# Patient Record
Sex: Female | Born: 1990 | Race: Black or African American | Hispanic: No | Marital: Single | Smoking: Former smoker
Health system: Southern US, Community
[De-identification: ages and names within clinical notes are randomized; demographics above are authoritative.]

## PROBLEM LIST (undated history)

## (undated) DIAGNOSIS — O24419 Gestational diabetes mellitus in pregnancy, unspecified control: Secondary | ICD-10-CM

## (undated) DIAGNOSIS — Z8742 Personal history of other diseases of the female genital tract: Secondary | ICD-10-CM

## (undated) DIAGNOSIS — Z8632 Personal history of gestational diabetes: Secondary | ICD-10-CM

## (undated) HISTORY — DX: Personal history of other diseases of the female genital tract: Z87.42

## (undated) HISTORY — DX: Personal history of gestational diabetes: Z86.32

---

## 2017-01-10 DIAGNOSIS — N644 Mastodynia: Secondary | ICD-10-CM | POA: Diagnosis not present

## 2017-01-10 DIAGNOSIS — Z309 Encounter for contraceptive management, unspecified: Secondary | ICD-10-CM | POA: Diagnosis not present

## 2017-03-18 DIAGNOSIS — J020 Streptococcal pharyngitis: Secondary | ICD-10-CM | POA: Diagnosis not present

## 2017-10-15 HISTORY — PX: WISDOM TOOTH EXTRACTION: SHX21

## 2019-08-07 DIAGNOSIS — Z3009 Encounter for other general counseling and advice on contraception: Secondary | ICD-10-CM | POA: Diagnosis not present

## 2019-08-07 DIAGNOSIS — Z3200 Encounter for pregnancy test, result unknown: Secondary | ICD-10-CM | POA: Diagnosis not present

## 2019-08-07 LAB — PREGNANCY, URINE: Preg Test, Ur: POSITIVE

## 2019-08-10 ENCOUNTER — Encounter (HOSPITAL_BASED_OUTPATIENT_CLINIC_OR_DEPARTMENT_OTHER): Payer: Self-pay | Admitting: *Deleted

## 2019-08-10 ENCOUNTER — Emergency Department (HOSPITAL_BASED_OUTPATIENT_CLINIC_OR_DEPARTMENT_OTHER)
Admission: EM | Admit: 2019-08-10 | Discharge: 2019-08-10 | Disposition: A | Discharge status: Home / Self Care | Payer: Medicaid Other | Attending: Emergency Medicine | Admitting: Emergency Medicine

## 2019-08-10 ENCOUNTER — Emergency Department (HOSPITAL_BASED_OUTPATIENT_CLINIC_OR_DEPARTMENT_OTHER): Payer: Medicaid Other

## 2019-08-10 ENCOUNTER — Other Ambulatory Visit: Payer: Self-pay

## 2019-08-10 DIAGNOSIS — Z3A01 Less than 8 weeks gestation of pregnancy: Secondary | ICD-10-CM | POA: Insufficient documentation

## 2019-08-10 DIAGNOSIS — O26891 Other specified pregnancy related conditions, first trimester: Secondary | ICD-10-CM | POA: Insufficient documentation

## 2019-08-10 DIAGNOSIS — O99891 Other specified diseases and conditions complicating pregnancy: Secondary | ICD-10-CM | POA: Diagnosis not present

## 2019-08-10 DIAGNOSIS — O26899 Other specified pregnancy related conditions, unspecified trimester: Secondary | ICD-10-CM

## 2019-08-10 DIAGNOSIS — Z3491 Encounter for supervision of normal pregnancy, unspecified, first trimester: Secondary | ICD-10-CM

## 2019-08-10 DIAGNOSIS — O0001 Abdominal pregnancy with intrauterine pregnancy: Secondary | ICD-10-CM | POA: Diagnosis not present

## 2019-08-10 DIAGNOSIS — Z87891 Personal history of nicotine dependence: Secondary | ICD-10-CM | POA: Insufficient documentation

## 2019-08-10 DIAGNOSIS — R1031 Right lower quadrant pain: Secondary | ICD-10-CM | POA: Diagnosis not present

## 2019-08-10 DIAGNOSIS — R202 Paresthesia of skin: Secondary | ICD-10-CM | POA: Diagnosis not present

## 2019-08-10 DIAGNOSIS — M79609 Pain in unspecified limb: Secondary | ICD-10-CM

## 2019-08-10 DIAGNOSIS — M79601 Pain in right arm: Secondary | ICD-10-CM | POA: Insufficient documentation

## 2019-08-10 LAB — PREGNANCY, URINE: Preg Test, Ur: POSITIVE — AB

## 2019-08-10 LAB — HCG, QUANTITATIVE, PREGNANCY: hCG, Beta Chain, Quant, S: 42404 m[IU]/mL — ABNORMAL HIGH (ref <5)

## 2019-08-10 MED ORDER — ONDANSETRON 4 MG PO TBDP: 4.0000 mg | ORAL_TABLET | Freq: Three times a day (TID) | ORAL | 0 refills | Status: DC | PRN

## 2019-08-10 MED ORDER — ONDANSETRON 4 MG PO TBDP
4.0000 mg | ORAL_TABLET | Freq: Once | ORAL | Status: AC
  Administered 2019-08-10: 14:00:00 4 mg via ORAL

## 2019-08-10 MED ORDER — ONDANSETRON 4 MG PO TBDP
ORAL_TABLET | ORAL | Status: AC
  Filled 2019-08-10: qty 1

## 2019-08-10 NOTE — ED Notes (Signed)
Patient transported to Ultrasound 

## 2019-08-10 NOTE — ED Notes (Signed)
ED Provider at bedside. 

## 2019-08-10 NOTE — Discharge Instructions (Signed)
Take 1 to 2 tablets of Tylenol every 6 hours as needed for pain.  Apply heat 20 minutes at a time 3-4 times daily as needed for muscle soreness and spasm.  You can also massage the area.  You can YouTube search physical therapy exercises for cervical radiculopathy or neck exercises.  Take Zofran as needed for nausea and vomiting.  Let this medicine dissolve under your tongue and wait around 10 or 15 minutes before you have anything to eat or drink to give this medicine time to work.  Follow-up with OB/GYN for reevaluation of pregnancy; ultrasound today was reassuring.  Follow-up with primary care provider for reevaluation of neck pain with numbness and tingling of the right arm.  Return to the emergency department if any concerning signs or symptoms develop such as persistent weakness, high fevers, persistent vomiting, or loss of consciousness.  Go to [facility] for any emergent pregnancy related concerns.

## 2019-08-10 NOTE — ED Provider Notes (Signed)
[facility] EMERGENCY DEPARTMENT Provider Note   CSN: [id number] Arrival date & time: 08/10/19  1110     History   Chief Complaint Chief Complaint  Patient presents with   Arm Pain    HPI Lauren Delacruz is a 28 y.o. female presents today for evaluation of intermittent paresthesias of the right upper extremity for 1 week as well as acute onset, progressively worsening lower abdominal cramping for 2 weeks.  She reports 2 weeks ago she found out she was pregnant, thinks she is around [redacted] weeks along.  Denies vaginal bleeding but does note cramping which is worse than usual menstrual cramps primarily on the right side of the abdomen but will radiate along the lower abdomen.  No aggravating or alleviating factors noted.  Denies any nausea or vomiting.  Has not been to see her OB/GYN yet.  No history of ectopic pregnancy.  She notes that for the last week she has experienced 4 episodes in which she will develop pain and paresthesias radiating from the right side of the neck to the shoulder down into the fingertips.  It does not seem to follow a particular path down the extremity or into specific fingers but more so all over.  Twice it occurred when she was awakening from sleep and the other 2 times it occurred while she was sitting on her couch.  She will feel as though the arm is heavy and weak as well.  Symptoms will last for around 5 or 6 minutes before resolving.  No specific injury noted though she did have a fall sometime in September does not think this is related.  Has not tried anything for her symptoms.  Denies chest pain, shortness of breath, headaches.     The history is provided by the patient.  Arm Pain Associated symptoms include abdominal pain. Pertinent negatives include no chest pain, no headaches and no shortness of breath.    History reviewed. No pertinent past medical history.  There are no active problems to display for this patient.   History reviewed. No  pertinent surgical history.   OB History    Gravida  3   Para  1   Term  1   Preterm  0   AB  1   Living  1     SAB  0   TAB  1   Ectopic  0   Multiple  0   Live Births  1            Home Medications    Prior to Admission medications   Medication Sig Start Date End Date Taking? Authorizing Provider  ondansetron (ZOFRAN ODT) 4 MG disintegrating tablet Take 1 tablet (4 mg total) by mouth every 8 (eight) hours as needed for nausea or vomiting. 08/10/19   Jeanie SewerFawze, [place] A, PA-C    Family History No family history on file.  Social History Social History   Tobacco Use   Smoking status: Former Smoker   Smokeless tobacco: Never Used   Tobacco comment: socially  Substance Use Topics   Alcohol use: Yes    Comment: socially   Drug use: Never     Allergies   Penicillins   Review of Systems Review of Systems  Constitutional: Negative for chills and fever.  Respiratory: Negative for shortness of breath.   Cardiovascular: Negative for chest pain.  Gastrointestinal: Positive for abdominal pain. Negative for nausea and vomiting.  Genitourinary: Negative for hematuria, vaginal bleeding, vaginal discharge and vaginal  pain.  Musculoskeletal: Positive for myalgias.  Neurological: Positive for weakness and numbness. Negative for headaches.  All other systems reviewed and are negative.    Physical Exam Updated Vital Signs BP 124/85 (BP Location: Left Arm)    Temp 98.5 F (36.9 C) (Oral)    Resp 16    Ht 5\' 4"  (1.626 m)    Wt 76.7 kg    LMP 06/30/2019 (Within Days) Comment: due 04/05/20   SpO2 100%    BMI 29.01 kg/m   Physical Exam Vitals signs and nursing note reviewed.  Constitutional:      General: She is not in acute distress.    Appearance: She is well-developed.  HENT:     Head: Normocephalic and atraumatic.  Eyes:     General:        Right eye: No discharge.        Left eye: No discharge.     Conjunctiva/sclera: Conjunctivae normal.  Neck:      Musculoskeletal: Normal range of motion and neck supple.     Vascular: No JVD.     Trachea: No tracheal deviation.     Comments: No midline cervical spine tenderness.  No deformity, crepitus, or step-off.  Right-sided paracervical muscle tenderness and spasm noted. Cardiovascular:     Rate and Rhythm: Normal rate.  Pulmonary:     Effort: Pulmonary effort is normal.  Abdominal:     General: Bowel sounds are normal. There is no distension.     Palpations: Abdomen is soft.     Tenderness: There is abdominal tenderness in the right lower quadrant and suprapubic area. There is no guarding or rebound.  Musculoskeletal: Normal range of motion.     Comments: 5/5 strength of BUE major muscle groups.  Spurling test positive  Skin:    General: Skin is warm and dry.     Findings: No erythema.  Neurological:     Mental Status: She is alert.     Comments: Sensation intact to light touch of bilateral upper and lower extremities.  Spurling test positive  Psychiatric:        Behavior: Behavior normal.      ED Treatments / Results  Labs (all labs ordered are listed, but only abnormal results are displayed) Labs Reviewed  HCG, QUANTITATIVE, PREGNANCY - Abnormal; Notable for the following components:      Result Value   hCG, Beta Chain, Quant, S 42,404 (*)    All other components within normal limits  PREGNANCY, URINE - Abnormal; Notable for the following components:   Preg Test, Ur POSITIVE (*)    All other components within normal limits    EKG None  Radiology 04/07/20 Ob Less Than 14 Weeks With Ob Transvaginal  Result Date: 08/10/2019 CLINICAL DATA:  Right lower quadrant pain 2-3 weeks. Positive urine pregnancy test. Serum quantitative beta HCG 42,404 today. Estimated gestational age per LMP 5 weeks 6 days. EXAM: OBSTETRIC <14 WK 08/12/2019 AND TRANSVAGINAL OB US TECHNIQUE: Both transabdominal and transvaginal ultrasound examinations were performed for complete evaluation of the gestation as well as  the maternal uterus, adnexal regions, and pelvic cul-de-sac. Transvaginal technique was performed to assess early pregnancy. COMPARISON:  None. FINDINGS: Intrauterine gestational sac: Single normal-appearing intrauterine gestational sac. Yolk sac:  Visualized. Embryo:  Visualized. Cardiac Activity: Visualized. Heart Rate: 101 bpm CRL: 3.9 mm   6 w   0 d                  [place]  EDC: 04/04/2020 Subchorionic hemorrhage:  None visualized. Maternal uterus/adnexae: Nabothian cyst present over the cervix. Ovaries are normal size, shape and position with normal color flow. Probable small corpus luteal cyst over the left ovary. IMPRESSION: Single live IUP with estimated gestational age [redacted] weeks 0 days. Electronically Signed   By: Marin Olp M.D.   On: 08/10/2019 15:13    Procedures Procedures (including critical care time)  Medications Ordered in ED Medications  ondansetron (ZOFRAN-ODT) 4 MG disintegrating tablet (has no administration in time range)  ondansetron (ZOFRAN-ODT) disintegrating tablet 4 mg (4 mg Oral Given 08/10/19 1411)     Initial Impression / Assessment and Plan / ED Course  I have reviewed the triage vital signs and the nursing notes.  Pertinent labs & imaging results that were available during my care of the patient were reviewed by me and considered in my medical decision making (see chart for details).  Clinical Course as of Aug 09 1604  Mon Aug 10, 2019  1401 HCG, Bosie Helper(!): 63,149 [MF]    Clinical Course User Index [MF] Renita Papa, [place]       Patient presenting for evaluation of intermittent right upper extremity paresthesias for 1 week as well as lower abdominal cramping for 2 weeks.  She estimates she is around [redacted] weeks pregnant.  She is afebrile, vital signs are stable.  She is nontoxic in appearance.  No peritoneal signs on examination of the abdomen.  As the pain is worse in the right lower quadrant will obtain quantitative hCG and pelvic ultrasound to rule  out ectopic pregnancy.  She has no vaginal bleeding or urinary symptoms.  Quantitative hCG appropriately elevated at 42,404, ultrasound shows single live IUP with estimated gestational age of [redacted] weeks and 0 days.  She recently relocated from [place] to the [place] area so we will give her resources for outpatient follow-up with OB/GYN.  She did develop some nausea while in the ED so she was given Zofran ODT with resolution of her symptoms I will give her a few tablets of this to go home with.  Also discussed management of hyperemesis in pregnancy.  With regards to her paresthesias, her symptoms are reproducible with Spurling's maneuver suggesting cervical radiculopathy.  She has no midline cervical spine tenderness, no fever or meningeal signs.  On my assessment she is neurovascularly intact.  No focal neurologic deficits.  Doubt CVA or TIA.  She also has some component of muscle spasm along the right trapezius.  This could also cause some nerve compression and irritation which could cause some of her symptoms.  We discussed conservative therapy and management with heat therapy, massage, gentle stretching, formal physical therapy and dry needling.  Recommend follow-up with PCP for reevaluation of her symptoms.  Discussed strict ED return precautions.  Patient and significant other verbalized understanding of and agreement with plan and patient stable for discharge home at this time.  Final Clinical Impressions(s) / ED Diagnoses   Final diagnoses:  Paresthesia and pain of right extremity  Normal intrauterine pregnancy on prenatal ultrasound in first trimester    ED Discharge Orders         Ordered    ondansetron (ZOFRAN ODT) 4 MG disintegrating tablet  Every 8 hours PRN     08/10/19 [address] [place] A, PA-C 08/10/19 1608    Isla Pence, MD 08/11/19 [phone]

## 2019-08-10 NOTE — ED Triage Notes (Addendum)
Tingling in arm  From neck to finger tips has happened 4 times in past week  Lasting 5 minutes at times,  Occurs when wakes in am  6 weeks preg

## 2019-09-28 ENCOUNTER — Encounter: Payer: Self-pay | Admitting: *Deleted

## 2019-09-29 ENCOUNTER — Encounter: Payer: Self-pay | Admitting: *Deleted

## 2019-10-15 ENCOUNTER — Other Ambulatory Visit: Payer: Self-pay

## 2019-10-15 ENCOUNTER — Ambulatory Visit (INDEPENDENT_AMBULATORY_CARE_PROVIDER_SITE_OTHER): Payer: Medicaid Other | Admitting: *Deleted

## 2019-10-15 DIAGNOSIS — Z8632 Personal history of gestational diabetes: Secondary | ICD-10-CM | POA: Insufficient documentation

## 2019-10-15 DIAGNOSIS — Z3490 Encounter for supervision of normal pregnancy, unspecified, unspecified trimester: Secondary | ICD-10-CM

## 2019-10-15 MED ORDER — BLOOD PRESSURE KIT DEVI: 1.0000 | 0 refills | Status: DC | PRN

## 2019-10-15 NOTE — Progress Notes (Signed)
I connected with  Lauren Delacruz on 10/15/19 at 10:30 AM EST by telephone and verified that I am speaking with the correct person using two identifiers.   I discussed the limitations, risks, security and privacy concerns of performing an evaluation and management service by telephone and the availability of in person appointments. I also discussed with the patient that there may be a patient responsible charge related to this service. The patient expressed understanding and agreed to proceed. Explained I am completing her New OB Intake today. We discussed Her EDD and that it is based on  sure LMP . I reviewed her allergies, meds, OB History, Medical /Surgical history, and appropriate screenings. I explained I will send her the [employer] app- app sent to her while on phone.  I explained we will send a blood pressure cuff to [facility] that will fill that prescription and they  will call her to verify her information. I asked her to bring the blood pressure cuff with her to her first ob appointment  Or next ob appointment so we can show her how to use it.- or if needed to call for a nurse visit appointment. I  Explained  then we will have her take her blood pressure weekly and enter into the app. Explained she will have some visits in office and some virtually. I sent her a MyChart text and she signed up for MyChart and downloaded the app. I reviewed her new ob  appointment date/ time with her , our location and to wear mask, no visitors.  I explained she will have a pelvic exam, ob bloodwork, hemoglobin a1C, cbg , genetic testing if desired,- she does want a panorama,  pap if needed. I scheduled an [place] at 19 weeks and gave her the appointment. She voices understanding.   Linda,RN 10/15/2019  10:37 AM

## 2019-10-15 NOTE — Patient Instructions (Signed)

## 2019-10-16 NOTE — L&D Delivery Note (Addendum)
Patient is 29 y.o. [id number] [redacted]w[redacted]d admitted for PPROM.    Delivery Note At 8:37 AM a viable female was delivered via Vaginal, Spontaneous (Presentation: Middle Occiput Anterior).  APGAR: 8, 9; weight pending.   Placenta status: Spontaneous, Intact. Cord: 3 vessels with the following complications: none.    Anesthesia: Epidural Episiotomy: None Lacerations: None Suture Repair: none Est. Blood Loss (mL): 300  Mom to postpartum.  Baby to Couplet care / Skin to Skin.  Upon arrival baby was already delivered and resting comfortably in moms arms.  There was no evidence of distress for the mother or baby.    The baby was placed on maternal abdomen for oral suctioning, drying and stimulation. Delayed cord clamping performed. Placenta delivered intact with 3V cord. Vaginal canal and perineum and intact. Pitocin was started and uterus massaged until bleeding slowed. Counts of sharps, instruments, and lap pads were all correct.   Mirian Mo, MD PGY-2 5/30/20218:54 AM  Midwife attestation: I was gloved and present for delivery in its entirety and I agree with the above resident's note.  Donette Larry, CNM 1:07 PM

## 2019-10-19 ENCOUNTER — Other Ambulatory Visit (HOSPITAL_COMMUNITY)
Admission: RE | Admit: 2019-10-19 | Discharge: 2019-10-19 | Disposition: A | Discharge status: Home / Self Care | Payer: Medicaid Other | Source: Ambulatory Visit | Attending: Obstetrics & Gynecology | Admitting: Obstetrics & Gynecology

## 2019-10-19 ENCOUNTER — Other Ambulatory Visit: Payer: Self-pay

## 2019-10-19 ENCOUNTER — Ambulatory Visit (INDEPENDENT_AMBULATORY_CARE_PROVIDER_SITE_OTHER): Payer: Medicaid Other | Admitting: Obstetrics & Gynecology

## 2019-10-19 VITALS — BP 121/67 | HR 80 | Wt 179.0 lb

## 2019-10-19 DIAGNOSIS — Z1389 Encounter for screening for other disorder: Secondary | ICD-10-CM

## 2019-10-19 DIAGNOSIS — Z8632 Personal history of gestational diabetes: Secondary | ICD-10-CM | POA: Diagnosis not present

## 2019-10-19 DIAGNOSIS — O09899 Supervision of other high risk pregnancies, unspecified trimester: Secondary | ICD-10-CM | POA: Diagnosis not present

## 2019-10-19 DIAGNOSIS — Z3490 Encounter for supervision of normal pregnancy, unspecified, unspecified trimester: Secondary | ICD-10-CM | POA: Diagnosis not present

## 2019-10-19 DIAGNOSIS — Z3A15 15 weeks gestation of pregnancy: Secondary | ICD-10-CM

## 2019-10-19 DIAGNOSIS — Z315 Encounter for genetic counseling: Secondary | ICD-10-CM | POA: Diagnosis not present

## 2019-10-19 DIAGNOSIS — O09892 Supervision of other high risk pregnancies, second trimester: Secondary | ICD-10-CM

## 2019-10-19 LAB — HEMOGLOBIN A1C
Est. average glucose Bld gHb Est-mCnc: 111 mg/dL
Hgb A1c MFr Bld: 5.5 % (ref 4.8–5.6)

## 2019-10-19 MED ORDER — METRONIDAZOLE 500 MG PO TABS: 500.0000 mg | ORAL_TABLET | Freq: Two times a day (BID) | ORAL | 0 refills | Status: DC

## 2019-10-19 NOTE — Progress Notes (Signed)
  Subjective:    Lauren Delacruz is being seen today for her first obstetrical visit.  This is not a planned pregnancy, but is a happy surprise. She is at [redacted]w[redacted]d gestation. Her obstetrical history is significant for h/o GDM with first pregnancy. Relationship with FOB: significant other, living together. Patient does intend to breast feed. Pregnancy history fully reviewed.  Patient reports no complaints.  Review of Systems:   Review of Systems  Objective:     BP 121/67   Pulse 80   Wt 179 lb (81.2 kg)   LMP 06/30/2019 (Within Days) Comment: due 04/05/20  BMI 30.73 kg/m  Physical Exam  Exam GENERAL: Well-developed, well-nourished female in no acute distress.  HEENT: Normocephalic, atraumatic. Sclerae anicteric.  NECK: Supple. Normal thyroid.  LUNGS: Clear to auscultation bilaterally.  HEART: Regular rate and rhythm. BREASTS: Symmetric with everted nipples. No masses, skin changes, nipple drainage, or lymphadenopathy. ABDOMEN: Soft, nontender, nondistended. No organomegaly. PELVIC: Normal external female genitalia. Vagina is pink and rugated.  Normal discharge. Normal cervix contour. Pap smear obtained. Frothy vaginal discharge. No adnexal mass or tenderness.  EXTREMITIES: No cyanosis, clubbing, or edema, 2+ distal pulses.     Assessment:    Pregnancy: G3P1011 Patient Active Problem List   Diagnosis Date Noted  . Supervision of other high risk pregnancy, antepartum 10/19/2019  . Supervision of low-risk pregnancy 10/15/2019  . History of gestational diabetes mellitus (GDM)        Plan:     Initial labs drawn. Prenatal vitamins. Problem list reviewed and updated. NIPS drawn Role of ultrasound in pregnancy discussed; fetal survey: ordered. Amniocentesis discussed: not indicated. Follow up in 4 weeks for early 2 hour GTT Flagyl prescribed and wet prep sent Pap obtained She declines flu vaccine   Allie Bossier 10/19/2019

## 2019-10-19 NOTE — Progress Notes (Signed)
Medicaid home form completed.   Fleet Contras RN 10/19/19

## 2019-10-20 ENCOUNTER — Other Ambulatory Visit: Payer: Self-pay | Admitting: Obstetrics & Gynecology

## 2019-10-20 ENCOUNTER — Encounter: Payer: Self-pay | Admitting: Obstetrics & Gynecology

## 2019-10-20 DIAGNOSIS — O99019 Anemia complicating pregnancy, unspecified trimester: Secondary | ICD-10-CM | POA: Insufficient documentation

## 2019-10-20 LAB — OBSTETRIC PANEL, INCLUDING HIV
Antibody Screen: NEGATIVE
Basophils Absolute: 0 10*3/uL (ref 0.0–0.2)
Basos: 0 %
EOS (ABSOLUTE): 0.1 10*3/uL (ref 0.0–0.4)
Eos: 1 %
HIV Screen 4th Generation wRfx: NONREACTIVE
Hematocrit: 29.6 % — ABNORMAL LOW (ref 34.0–46.6)
Hemoglobin: 9.9 g/dL — ABNORMAL LOW (ref 11.1–15.9)
Hepatitis B Surface Ag: NEGATIVE
Immature Grans (Abs): 0.1 10*3/uL (ref 0.0–0.1)
Immature Granulocytes: 1 %
Lymphocytes Absolute: 1.9 10*3/uL (ref 0.7–3.1)
Lymphs: 16 %
MCH: 28.2 pg (ref 26.6–33.0)
MCHC: 33.4 g/dL (ref 31.5–35.7)
MCV: 84 fL (ref 79–97)
Monocytes Absolute: 0.5 10*3/uL (ref 0.1–0.9)
Monocytes: 5 %
Neutrophils Absolute: 9.2 10*3/uL — ABNORMAL HIGH (ref 1.4–7.0)
Neutrophils: 77 %
Platelets: 322 10*3/uL (ref 150–450)
RBC: 3.51 x10E6/uL — ABNORMAL LOW (ref 3.77–5.28)
RDW: 13.1 % (ref 11.7–15.4)
RPR Ser Ql: NONREACTIVE
Rh Factor: POSITIVE
Rubella Antibodies, IGG: 3.13 index (ref >0.99)
WBC: 11.8 10*3/uL — ABNORMAL HIGH (ref 3.4–10.8)

## 2019-10-20 LAB — CERVICOVAGINAL ANCILLARY ONLY
Bacterial Vaginitis (gardnerella): POSITIVE — AB
Candida Glabrata: NEGATIVE
Candida Vaginitis: POSITIVE — AB
Chlamydia: NEGATIVE
Comment: NEGATIVE
Comment: NEGATIVE
Comment: NEGATIVE
Comment: NEGATIVE
Comment: NEGATIVE
Comment: NORMAL
Neisseria Gonorrhea: NEGATIVE
Trichomonas: NEGATIVE

## 2019-10-20 LAB — CYTOLOGY - PAP
Chlamydia: NEGATIVE
Comment: NEGATIVE
Comment: NEGATIVE
Comment: NORMAL
Diagnosis: NEGATIVE
High risk HPV: NEGATIVE
Neisseria Gonorrhea: NEGATIVE

## 2019-10-20 NOTE — Progress Notes (Signed)
fereheme ordered for hbg of 9.9 at 16 weeks I sent a message to patient through Mychart

## 2019-10-21 ENCOUNTER — Encounter: Payer: Self-pay | Admitting: *Deleted

## 2019-10-21 LAB — URINE CULTURE, OB REFLEX

## 2019-10-21 LAB — CULTURE, OB URINE

## 2019-10-23 NOTE — Progress Notes (Signed)
I have reviewed this chart and agree with the RN/CMA assessment and management.    Honesti Seaberg C Evetta Renner, MD, FACOG Attending Physician, Faculty Practice [facility]  

## 2019-10-28 ENCOUNTER — Encounter: Payer: Self-pay | Admitting: *Deleted

## 2019-11-03 ENCOUNTER — Telehealth (INDEPENDENT_AMBULATORY_CARE_PROVIDER_SITE_OTHER): Payer: Medicaid Other

## 2019-11-03 DIAGNOSIS — Z712 Person consulting for explanation of examination or test findings: Secondary | ICD-10-CM

## 2019-11-03 NOTE — Telephone Encounter (Signed)
Called pt with [employer]. Pt states she saw that she was a carrier for 1 disease. 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 Pt would like to know if baby's father needs to be screened. Phone number given for pt to make genetic counseling appt. Explained that during that appointment Lauren Delacruz will let her know if any more screening is necessary.   Pt requests appts on 11/16/19 be moved due to a conflict with her work schedule. Message sent to front office to contact pt to reschedule both appts on 11/16/19.

## 2019-11-04 ENCOUNTER — Telehealth: Payer: Self-pay | Admitting: Obstetrics & Gynecology

## 2019-11-04 NOTE — Telephone Encounter (Signed)
Spoke with Lauren Delacruz this morning about her appointment. She stated she started a new job, and her working hours are 9am to 6pm. She is wanting to know if she can do a 1 hour GTT test.

## 2019-11-04 NOTE — Telephone Encounter (Signed)
-----   Message from Marjo Bicker, RN sent at 11/03/2019  1:39 PM EST ----- Regarding: reschedule appts on 2/1 She is starting a new job on 2/1 and will not be able to make either appt. Can her appts be rescheduled? She was coming for prenatal and 2hr GTT lab visit. Thanks!

## 2019-11-09 ENCOUNTER — Encounter: Payer: Self-pay | Admitting: *Deleted

## 2019-11-10 ENCOUNTER — Encounter: Payer: Self-pay | Admitting: Obstetrics & Gynecology

## 2019-11-10 DIAGNOSIS — D563 Thalassemia minor: Secondary | ICD-10-CM | POA: Insufficient documentation

## 2019-11-12 ENCOUNTER — Ambulatory Visit (HOSPITAL_COMMUNITY)
Admission: RE | Admit: 2019-11-12 | Discharge: 2019-11-12 | Disposition: A | Discharge status: Home / Self Care | Payer: Medicaid Other | Source: Ambulatory Visit | Attending: Obstetrics & Gynecology | Admitting: Obstetrics & Gynecology

## 2019-11-12 ENCOUNTER — Other Ambulatory Visit: Payer: Self-pay

## 2019-11-12 DIAGNOSIS — Z148 Genetic carrier of other disease: Secondary | ICD-10-CM | POA: Diagnosis not present

## 2019-11-12 DIAGNOSIS — Z3A19 19 weeks gestation of pregnancy: Secondary | ICD-10-CM

## 2019-11-12 DIAGNOSIS — Z3490 Encounter for supervision of normal pregnancy, unspecified, unspecified trimester: Secondary | ICD-10-CM | POA: Insufficient documentation

## 2019-11-16 ENCOUNTER — Encounter: Payer: Medicaid Other | Admitting: Nurse Practitioner

## 2019-11-16 ENCOUNTER — Other Ambulatory Visit: Payer: Medicaid Other

## 2019-11-30 ENCOUNTER — Telehealth: Payer: Self-pay | Admitting: Family Medicine

## 2019-11-30 NOTE — Telephone Encounter (Signed)
Ms. 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 She will not be able to have an appointment until March. She understands it's been awhile, but she has to complete her training.

## 2019-12-01 ENCOUNTER — Encounter: Payer: Medicaid Other | Admitting: Certified Nurse Midwife

## 2019-12-16 ENCOUNTER — Ambulatory Visit (INDEPENDENT_AMBULATORY_CARE_PROVIDER_SITE_OTHER): Payer: Medicaid Other

## 2019-12-16 ENCOUNTER — Other Ambulatory Visit: Payer: Self-pay

## 2019-12-16 VITALS — BP 124/72 | HR 95 | Wt 187.0 lb

## 2019-12-16 DIAGNOSIS — Z8632 Personal history of gestational diabetes: Secondary | ICD-10-CM | POA: Diagnosis not present

## 2019-12-16 DIAGNOSIS — O09892 Supervision of other high risk pregnancies, second trimester: Secondary | ICD-10-CM

## 2019-12-16 DIAGNOSIS — Z3A24 24 weeks gestation of pregnancy: Secondary | ICD-10-CM

## 2019-12-16 DIAGNOSIS — O09899 Supervision of other high risk pregnancies, unspecified trimester: Secondary | ICD-10-CM

## 2019-12-16 DIAGNOSIS — O99012 Anemia complicating pregnancy, second trimester: Secondary | ICD-10-CM

## 2019-12-16 NOTE — Addendum Note (Signed)
Addended by: Ernestina Patches on: 12/16/2019 10:38 AM   Modules accepted: Orders

## 2019-12-16 NOTE — Progress Notes (Signed)
   PRENATAL VISIT NOTE  Subjective:  Lauren Delacruz is a 29 y.o. G3P1011 at [redacted]w[redacted]d who presents today for routine prenatal care.  She is currently being monitored for supervision of a high-risk pregnancy with problems as listed below.  Patient endorses fetal movement.  She denies vaginal concerns including discharge, bleeding, leaking, itching, and burning. However, she does report some pelvic pressure.  She denies abdominal pain, cramping, or contractions.   Patient Active Problem List   Diagnosis Date Noted  . Alpha thalassemia silent carrier 11/10/2019  . Anemia in pregnancy 10/20/2019  . Supervision of other high risk pregnancy, antepartum 10/19/2019  . History of gestational diabetes mellitus (GDM)     The following portions of the patient's history were reviewed and updated as appropriate: allergies, current medications, past family history, past medical history, past social history, past surgical history and problem list. Problem list updated.  Objective:   Vitals:   12/16/19 0959  BP: 124/72  Pulse: 95  Weight: 187 lb (84.8 kg)    Fetal Status: Fetal Heart Rate (bpm): 154   Movement: Present     General:  Alert, oriented and cooperative. Patient is in no acute distress.  Skin: Skin is warm and dry.   Cardiovascular: Regular rate and rhythm.  Respiratory: Normal respiratory effort. CTA-Bilaterally  Abdomen: Soft, gravid, appropriate for gestational age.  Pelvic: Cervical exam deferred        Extremities: Normal range of motion.  Edema: None  Mental Status: Normal mood and affect. Normal behavior. Normal judgment and thought content.   Assessment and Plan:  Pregnancy: G3P1011 at [redacted]w[redacted]d  1. Supervision of other high risk pregnancy, antepartum -Anticipatory guidance for upcoming appts. -Reviewed normal pregnancy discomforts including pelvic pain. -Discussed supplementations during pregnancy.  Informed that PNV, D3, and fish oil are more than enough!  2. History of  gestational diabetes mellitus (GDM) -Early GTT collected today. -Reviewed how results are released. -Discussed what will occur with abnormal results.    3. Anemia during pregnancy in second trimester -Instructed to increase iron supplement to BID and to take with OJ to increase absorption. -Discussed possibility of Feraheme infusion if levels remain low. -Will collect CBC   Preterm labor symptoms and general obstetric precautions including but not limited to vaginal bleeding, contractions, leaking of fluid and fetal movement were reviewed with the patient.  Please refer to After Visit Summary for other counseling recommendations.  Return in about 4 weeks (around 01/13/2020) for HR-ROB VV.  No future appointments.  Cherre Robins, CNM 12/16/2019, 10:11 AM

## 2019-12-17 DIAGNOSIS — G44209 Tension-type headache, unspecified, not intractable: Secondary | ICD-10-CM | POA: Diagnosis not present

## 2019-12-17 DIAGNOSIS — H40033 Anatomical narrow angle, bilateral: Secondary | ICD-10-CM | POA: Diagnosis not present

## 2019-12-17 LAB — GLUCOSE TOLERANCE, 1 HOUR: Glucose, 1Hr PP: 145 mg/dL (ref 65–199)

## 2019-12-31 ENCOUNTER — Encounter: Payer: Medicaid Other | Admitting: Certified Nurse Midwife

## 2020-01-01 ENCOUNTER — Encounter: Payer: Medicaid Other | Admitting: Family Medicine

## 2020-01-04 ENCOUNTER — Encounter (HOSPITAL_COMMUNITY): Payer: Self-pay | Admitting: Emergency Medicine

## 2020-01-04 ENCOUNTER — Ambulatory Visit (HOSPITAL_COMMUNITY)
Admission: EM | Admit: 2020-01-04 | Discharge: 2020-01-04 | Disposition: A | Discharge status: Home / Self Care | Payer: Medicaid Other | Attending: Physician Assistant | Admitting: Physician Assistant

## 2020-01-04 ENCOUNTER — Other Ambulatory Visit: Payer: Self-pay

## 2020-01-04 DIAGNOSIS — A084 Viral intestinal infection, unspecified: Secondary | ICD-10-CM | POA: Insufficient documentation

## 2020-01-04 DIAGNOSIS — Z8249 Family history of ischemic heart disease and other diseases of the circulatory system: Secondary | ICD-10-CM | POA: Diagnosis not present

## 2020-01-04 DIAGNOSIS — Z20822 Contact with and (suspected) exposure to covid-19: Secondary | ICD-10-CM | POA: Insufficient documentation

## 2020-01-04 DIAGNOSIS — R1110 Vomiting, unspecified: Secondary | ICD-10-CM | POA: Diagnosis present

## 2020-01-04 DIAGNOSIS — Z8632 Personal history of gestational diabetes: Secondary | ICD-10-CM

## 2020-01-04 DIAGNOSIS — Z880 Allergy status to penicillin: Secondary | ICD-10-CM | POA: Insufficient documentation

## 2020-01-04 DIAGNOSIS — O98512 Other viral diseases complicating pregnancy, second trimester: Secondary | ICD-10-CM | POA: Diagnosis not present

## 2020-01-04 DIAGNOSIS — Z3A27 27 weeks gestation of pregnancy: Secondary | ICD-10-CM | POA: Diagnosis not present

## 2020-01-04 DIAGNOSIS — Z87891 Personal history of nicotine dependence: Secondary | ICD-10-CM | POA: Insufficient documentation

## 2020-01-04 LAB — GLUCOSE, CAPILLARY: Glucose-Capillary: 97 mg/dL (ref 70–99)

## 2020-01-04 LAB — CBG MONITORING, ED: Glucose-Capillary: 97 mg/dL (ref 70–99)

## 2020-01-04 MED ORDER — ONDANSETRON 4 MG PO TBDP
4.0000 mg | ORAL_TABLET | Freq: Once | ORAL | Status: AC
  Administered 2020-01-04: 4 mg via ORAL

## 2020-01-04 MED ORDER — ONDANSETRON 4 MG PO TBDP
ORAL_TABLET | ORAL | Status: AC
  Filled 2020-01-04: qty 1

## 2020-01-04 MED ORDER — ONDANSETRON HCL 4 MG PO TABS: 4.0000 mg | ORAL_TABLET | Freq: Three times a day (TID) | ORAL | 0 refills | Status: DC | PRN

## 2020-01-04 NOTE — ED Provider Notes (Signed)
[place]    CSN: [id number] Arrival date & time: 01/04/20  1907      History   Chief Complaint Chief Complaint  Patient presents with  . Vomiting    HPI Lauren Delacruz is a 29 y.o. female.   Patient reports urgent care for evaluation of vomiting that started at noon today.  She reports 3-4 episodes of vomiting.  She has not been able to keep down solid food today.  She reports drinking some ginger ale however vomited most of this back up.  She has had minimal ability to keep water down.  She has also developed body aches and some chills throughout the day.  There has been no blood in the vomit.  She denies diarrhea.  She denies associated symptoms of cough, congestion, chest tightness or pain.  Denies abdominal pain, vaginal bleeding or discharge.  She reports recent increased thirst over the last week to 7-10 days.  She denies increased hunger.  She does feel like she has been dehydrated despite drinking a lot of water over the last week and a half.  She reports having issues with gestational diabetes in her first pregnancy.  However has not had this issue with this pregnancy and had a recent Glucola 2 to 3 weeks ago that was negative.  She did call her OB triage line and was instructed to come in and get evaluated today.     Past Medical History:  Diagnosis Date  . History of gestational diabetes mellitus (GDM)   . History of ovarian cyst     Patient Active Problem List   Diagnosis Date Noted  . Alpha thalassemia silent carrier 11/10/2019  . Anemia in pregnancy 10/20/2019  . Supervision of other high risk pregnancy, antepartum 10/19/2019  . History of gestational diabetes mellitus (GDM)     History reviewed. No pertinent surgical history.  OB History    Gravida  3   Para  1   Term  1   Preterm  0   AB  1   Living  1     SAB  0   TAB  1   Ectopic  0   Multiple  0   Live Births  1            Home Medications    Prior to Admission  medications   Medication Sig Start Date End Date Taking? Authorizing Provider  ferrous sulfate 325 (65 FE) MG tablet Take 325 mg by mouth daily with breakfast.   Yes [provider]  Prenatal Vit-Fe Fumarate-FA (MULTIVITAMIN-PRENATAL) 27-0.8 MG TABS tablet Take 1 tablet by mouth daily at 12 noon.   Yes [provider]  Blood Pressure Monitoring (BLOOD PRESSURE KIT) DEVI 1 Device by Does not apply route as needed. 10/15/19   Emily Filbert, MD  metroNIDAZOLE (FLAGYL) 500 MG tablet Take 1 tablet (500 mg total) by mouth 2 (two) times daily. Patient not taking: Reported on 12/16/2019 10/19/19   Emily Filbert, MD  ondansetron (ZOFRAN) 4 MG tablet Take 1 tablet (4 mg total) by mouth every 8 (eight) hours as needed for nausea or vomiting. 01/04/20   Mariama Saintvil, Marguerita Beards, PA-C    Family History Family History  Problem Relation Age of Onset  . Hypertension Mother   . Hypertension Father   . Prostate cancer Father   . Breast cancer Paternal Aunt   . Hypertension Maternal Grandmother     Social History Social History   Tobacco Use  .  Smoking status: Former Research scientist (life sciences)  . Smokeless tobacco: Never Used  . Tobacco comment: socially  Substance Use Topics  . Alcohol use: Not Currently    Comment: socially  . Drug use: Never     Allergies   Penicillins   Review of Systems Review of Systems  Constitutional: Positive for chills and fatigue. Negative for fever.  HENT: Negative.   Respiratory: Negative for cough and shortness of breath.   Cardiovascular: Negative for chest pain and palpitations.  Gastrointestinal: Positive for nausea and vomiting. Negative for abdominal distention, abdominal pain, blood in stool and diarrhea.  Genitourinary: Negative for dysuria, frequency and urgency.  Musculoskeletal: Positive for myalgias. Negative for arthralgias and back pain.  Skin: Negative for color change and rash.  Neurological: Negative for headaches.     Physical Exam Triage Vital Signs ED  Triage Vitals [01/04/20 1954]  Enc Vitals Group     BP      Pulse      Resp      Temp      Temp src      SpO2      Weight      Height      Head Circumference      Peak Flow      Pain Score 9     Pain Loc      Pain Edu?      Excl. in [place]?    No data found.  Updated Vital Signs BP 113/71 (BP Location: Right Arm)   Pulse (!) 112   Temp 99.9 F (37.7 C) (Oral)   Resp (!) 22   LMP 06/30/2019 (Within Days) Comment: due 04/05/20  SpO2 100%   Visual Acuity Right Eye Distance:   Left Eye Distance:   Bilateral Distance:    Right Eye Near:   Left Eye Near:    Bilateral Near:     Physical Exam Vitals and nursing note reviewed.  Constitutional:      General: She is not in acute distress.    Appearance: She is well-developed. She is not ill-appearing.  HENT:     Head: Normocephalic and atraumatic.     Nose: Nose normal. No congestion.     Mouth/Throat:     Mouth: Mucous membranes are moist.     Pharynx: Oropharynx is clear.  Eyes:     Extraocular Movements: Extraocular movements intact.     Conjunctiva/sclera: Conjunctivae normal.     Pupils: Pupils are equal, round, and reactive to light.  Cardiovascular:     Rate and Rhythm: Normal rate and regular rhythm.     Heart sounds: No murmur.  Pulmonary:     Effort: Pulmonary effort is normal. No respiratory distress.     Breath sounds: Normal breath sounds.  Abdominal:     Palpations: Abdomen is soft.     Tenderness: There is no abdominal tenderness. There is no right CVA tenderness or left CVA tenderness.  Musculoskeletal:     Cervical back: Neck supple.     Right lower leg: No edema.     Left lower leg: No edema.  Skin:    General: Skin is warm and dry.  Neurological:     Mental Status: She is alert.      UC Treatments / Results  Labs (all labs ordered are listed, but only abnormal results are displayed) Labs Reviewed  SARS CORONAVIRUS 2 (TAT 6-24 HRS)  GLUCOSE, CAPILLARY  CBG MONITORING, ED     EKG   Radiology  No results found.  Procedures Procedures (including critical care time)  Medications Ordered in UC Medications  ondansetron (ZOFRAN-ODT) disintegrating tablet 4 mg (4 mg Oral Given 01/04/20 2035)    Initial Impression / Assessment and Plan / UC Course  I have reviewed the triage vital signs and the nursing notes.  Pertinent labs & imaging results that were available during my care of the patient were reviewed by me and considered in my medical decision making (see chart for details).     #Gastroenteritis Patient 29 year old [redacted] weeks pregnant presenting with acute onset of vomiting, body ache and subjective fever today.  She is well-appearing in clinic, low-grade fever mild increase in her heart rate she is also likely secondary to pregnancy as well.  Glucose normal.  Covid PCR was obtained and sent.  Given Zofran in clinic.  Given she is hemodynamically well without further complaint will send home with Zofran and instructions to follow-up with OB or report to emergency department if she continues to not tolerate oral intake.  Patient verbalizes understanding. -Increase p.o. liquid intake with water and electrolyte beverages.    Final Clinical Impressions(s) / UC Diagnoses   Final diagnoses:  Viral gastroenteritis     Discharge Instructions     We have given you nausea medication here in clinic.  Please pick up your nausea medication in the morning.  If you continue to have a lot of vomiting despite treatment developed a high fever or have severe abdominal pain , I want you to go to the maternal unit emergency department at [facility].  Drink small sips of water and electrolyte beverages.  You may try to eat small solid foods however I prefer for you to attempt liquids.  You may take 2 regular strength Tylenol up to every 6-8 hours for body ache and fever.  Follow-up with your OB at your next appointment.  If your Covid-19 test is positive, you will  receive a phone call from [facility] regarding your results. Negative test results are not called. Both positive and negative results area always visible on MyChart. If you do not have a MyChart account, sign up instructions are in your discharge papers.   Persons who are directed to care for themselves at home may discontinue isolation under the following conditions:  . At least 10 days have passed since symptom onset and . At least 24 hours have passed without running a fever (this means without the use of fever-reducing medications) and . Other symptoms have improved.  Persons infected with COVID-19 who never develop symptoms may discontinue isolation and other precautions 10 days after the date of their first positive COVID-19 test.     ED Prescriptions    Medication Sig Dispense Auth. Provider   ondansetron (ZOFRAN) 4 MG tablet  (Status: Discontinued) Take 1 tablet (4 mg total) by mouth every 8 (eight) hours as needed for nausea or vomiting. 6 tablet Merle Whitehorn, Marguerita Beards, PA-C   ondansetron (ZOFRAN) 4 MG tablet Take 1 tablet (4 mg total) by mouth every 8 (eight) hours as needed for nausea or vomiting. 6 tablet [employer], Marguerita Beards, PA-C     PDMP not reviewed this encounter.   Purnell Shoemaker, PA-C 01/05/20 [phone]

## 2020-01-04 NOTE — ED Triage Notes (Signed)
Vomiting started around noon, generalized body aches, exhausted.  Patient is [redacted] weeks pregnant

## 2020-01-04 NOTE — Discharge Instructions (Addendum)
We have given you nausea medication here in clinic.  Please pick up your nausea medication in the morning.  If you continue to have a lot of vomiting despite treatment developed a high fever or have severe abdominal pain , I want you to go to the maternal unit emergency department at [facility].  Drink small sips of water and electrolyte beverages.  You may try to eat small solid foods however I prefer for you to attempt liquids.  You may take 2 regular strength Tylenol up to every 6-8 hours for body ache and fever.  Follow-up with your OB at your next appointment.  If your Covid-19 test is positive, you will receive a phone call from [facility] regarding your results. Negative test results are not called. Both positive and negative results area always visible on MyChart. If you do not have a MyChart account, sign up instructions are in your discharge papers.   Persons who are directed to care for themselves at home may discontinue isolation under the following conditions:   At least 10 days have passed since symptom onset and  At least 24 hours have passed without running a fever (this means without the use of fever-reducing medications) and  Other symptoms have improved.  Persons infected with COVID-19 who never develop symptoms may discontinue isolation and other precautions 10 days after the date of their first positive COVID-19 test.

## 2020-01-05 LAB — SARS CORONAVIRUS 2 (TAT 6-24 HRS): SARS Coronavirus 2: NEGATIVE

## 2020-01-09 DIAGNOSIS — H04123 Dry eye syndrome of bilateral lacrimal glands: Secondary | ICD-10-CM | POA: Diagnosis not present

## 2020-01-10 DIAGNOSIS — H5213 Myopia, bilateral: Secondary | ICD-10-CM | POA: Diagnosis not present

## 2020-01-11 ENCOUNTER — Ambulatory Visit (INDEPENDENT_AMBULATORY_CARE_PROVIDER_SITE_OTHER): Payer: Medicaid Other | Admitting: Family Medicine

## 2020-01-11 ENCOUNTER — Other Ambulatory Visit: Payer: Self-pay

## 2020-01-11 VITALS — BP 118/66 | HR 105 | Wt 197.8 lb

## 2020-01-11 DIAGNOSIS — Z23 Encounter for immunization: Secondary | ICD-10-CM

## 2020-01-11 DIAGNOSIS — O09899 Supervision of other high risk pregnancies, unspecified trimester: Secondary | ICD-10-CM

## 2020-01-11 DIAGNOSIS — Z8632 Personal history of gestational diabetes: Secondary | ICD-10-CM

## 2020-01-11 DIAGNOSIS — O99013 Anemia complicating pregnancy, third trimester: Secondary | ICD-10-CM

## 2020-01-11 NOTE — Progress Notes (Signed)
   PRENATAL VISIT NOTE  Subjective:  Lauren Delacruz is a 29 y.o. G3P1011 at [redacted]w[redacted]d being seen today for ongoing prenatal care.  She is currently monitored for the following issues for this high-risk pregnancy and has History of gestational diabetes mellitus (GDM); Supervision of other high risk pregnancy, antepartum; Anemia in pregnancy; and Alpha thalassemia silent carrier on their problem list.  Patient reports no complaints.  Contractions: Not present. Vag. Bleeding: None.  Movement: Present. Denies leaking of fluid.   The following portions of the patient's history were reviewed and updated as appropriate: allergies, current medications, past family history, past medical history, past social history, past surgical history and problem list.   Objective:   Vitals:   01/11/20 1552  BP: 118/66  Pulse: (!) 105  Weight: 197 lb 12.8 oz (89.7 kg)    Fetal Status: Fetal Heart Rate (bpm): 148 Fundal Height: 28 cm Movement: Present     General:  Alert, oriented and cooperative. Patient is in no acute distress.  Skin: Skin is warm and dry. No rash noted.   Cardiovascular: Normal heart rate noted  Respiratory: Normal respiratory effort, no problems with respiration noted  Abdomen: Soft, gravid, appropriate for gestational age.  Pain/Pressure: Present     Pelvic: Cervical exam deferred        Extremities: Normal range of motion.  Edema: None  Mental Status: Normal mood and affect. Normal behavior. Normal judgment and thought content.   Assessment and Plan:  Pregnancy: G3P1011 at [redacted]w[redacted]d 1. Supervision of other high risk pregnancy, antepartum FHT and FH normal. Labs will be deferred until 3hr GTT as she didn't pass her 1hr GTT. - HIV Antibody (routine testing w rflx) - RPR - CBC  2. History of gestational diabetes mellitus (GDM) Needs 3hr GTT  3. Anemia during pregnancy in third trimester Check blood counts next visit  Preterm labor symptoms and general obstetric precautions including but  not limited to vaginal bleeding, contractions, leaking of fluid and fetal movement were reviewed in detail with the patient. Please refer to After Visit Summary for other counseling recommendations.   Return in about 2 weeks (around 01/25/2020) for HR OB f/u.  Future Appointments  Date Time Provider Department Center  01/13/2020  8:20 AM [facility]  01/29/2020  9:55 AM Anyanwu, Jethro Bastos, MD [facility]    Levie Heritage, DO

## 2020-01-13 ENCOUNTER — Telehealth: Payer: Self-pay | Admitting: Obstetrics & Gynecology

## 2020-01-13 ENCOUNTER — Other Ambulatory Visit: Payer: Medicaid Other

## 2020-01-13 ENCOUNTER — Other Ambulatory Visit: Payer: Self-pay | Admitting: General Practice

## 2020-01-13 DIAGNOSIS — Z8632 Personal history of gestational diabetes: Secondary | ICD-10-CM

## 2020-01-13 NOTE — Telephone Encounter (Signed)
Attempted to reach patient about her missed appointment. I left a voicemail message for her to call the office to get rescheduled.

## 2020-01-29 ENCOUNTER — Encounter: Payer: Self-pay | Admitting: Obstetrics & Gynecology

## 2020-01-29 ENCOUNTER — Ambulatory Visit (INDEPENDENT_AMBULATORY_CARE_PROVIDER_SITE_OTHER): Payer: Medicaid Other | Admitting: Obstetrics & Gynecology

## 2020-01-29 ENCOUNTER — Other Ambulatory Visit: Payer: Self-pay

## 2020-01-29 ENCOUNTER — Other Ambulatory Visit: Payer: Medicaid Other

## 2020-01-29 VITALS — BP 122/80 | HR 99 | Wt 197.0 lb

## 2020-01-29 DIAGNOSIS — O09899 Supervision of other high risk pregnancies, unspecified trimester: Secondary | ICD-10-CM | POA: Diagnosis not present

## 2020-01-29 DIAGNOSIS — Z8632 Personal history of gestational diabetes: Secondary | ICD-10-CM

## 2020-01-29 DIAGNOSIS — O99810 Abnormal glucose complicating pregnancy: Secondary | ICD-10-CM

## 2020-01-29 DIAGNOSIS — Z3A30 30 weeks gestation of pregnancy: Secondary | ICD-10-CM

## 2020-01-29 NOTE — Progress Notes (Signed)
   PRENATAL VISIT NOTE  Subjective:  Lauren Delacruz is a 29 y.o. G3P1011 at [redacted]w[redacted]d being seen today for ongoing prenatal care.  She is currently monitored for the following issues for this high-risk pregnancy and has History of gestational diabetes mellitus (GDM); Supervision of other high risk pregnancy, antepartum; Anemia in pregnancy; and Alpha thalassemia silent carrier on their problem list.  Patient reports no complaints.  Contractions: Not present. Vag. Bleeding: None.  Movement: Present. Denies leaking of fluid.   The following portions of the patient's history were reviewed and updated as appropriate: allergies, current medications, past family history, past medical history, past social history, past surgical history and problem list.   Objective:   Vitals:   01/29/20 0852  BP: 122/80  Pulse: 99  Weight: 197 lb (89.4 kg)    Fetal Status: Fetal Heart Rate (bpm): 132 Fundal Height: 30 cm Movement: Present     General:  Alert, oriented and cooperative. Patient is in no acute distress.  Skin: Skin is warm and dry. No rash noted.   Cardiovascular: Normal heart rate noted  Respiratory: Normal respiratory effort, no problems with respiration noted  Abdomen: Soft, gravid, appropriate for gestational age.  Pain/Pressure: Present     Pelvic: Cervical exam deferred        Extremities: Normal range of motion.  Edema: None  Mental Status: Normal mood and affect. Normal behavior. Normal judgment and thought content.   Assessment and Plan:  Pregnancy: G3P1011 at [redacted]w[redacted]d 1. Abnormal glucose tolerance test (GTT) during pregnancy, antepartum 2. History of gestational diabetes mellitus (GDM) 3. Supervision of other high risk pregnancy, antepartum Patient had abnormal 1 hr GTT, getting 2 hr GTT and other third trimester labs today.  Counseled about contraception, she desires immediate postplacental IUD (Paragard) vs Depo Provera. Preterm labor symptoms and general obstetric precautions including  but not limited to vaginal bleeding, contractions, leaking of fluid and fetal movement were reviewed in detail with the patient. Please refer to After Visit Summary for other counseling recommendations.   Return in about 2 weeks (around 02/12/2020) for Virtual OB Visit.  Future Appointments  Date Time Provider Department Center  02/10/2020  2:35 PM Akutan Bing, MD [facility]    Jaynie Collins, MD

## 2020-01-29 NOTE — Patient Instructions (Addendum)
Return to office for any scheduled appointments. Call the office or go to the MAU at [facility] if:  You begin to have strong, frequent contractions  Your water breaks.  Sometimes it is a big gush of fluid, sometimes it is just a trickle that keeps getting your panties wet or running down your legs  You have vaginal bleeding.  It is normal to have a small amount of spotting if your cervix was checked.   You do not feel your baby moving like normal.  If you do not, get something to eat and drink and lay down and focus on feeling your baby move.   If your baby is still not moving like normal, you should call the office or go to MAU.  Any other obstetric concerns.    Intrauterine Device Information An intrauterine device (IUD) is a medical device that is inserted in the uterus to prevent pregnancy. It is a small, T-shaped device that has one or two nylon strings hanging down from it. The strings hang out of the lower part of the uterus (cervix) to allow for future IUD removal. There are two types of IUDs available:  Hormone IUD. This type of IUD is made of plastic and contains the hormone progestin (synthetic progesterone). A hormone IUD may last 3-5 years.  Copper IUD. This type of IUD has copper wire wrapped around it. A copper IUD may last up to 10 years. How is an IUD inserted? An IUD is inserted through the vagina and placed into the uterus with a minor medical procedure. The exact procedure for IUD insertion may vary among health care providers and hospitals. How does an IUD work? Synthetic progesterone in a hormonal IUD prevents pregnancy by:  Thickening cervical mucus to prevent sperm from entering the uterus.  Thinning the uterine lining to prevent a fertilized egg from being implanted there. Copper in a copper IUD prevents pregnancy by making the uterus and fallopian tubes produce a fluid that kills sperm. What are the advantages of an  IUD? Advantages of either type of IUD  It is highly effective in preventing pregnancy.  It is reversible. You can become pregnant shortly after the IUD is removed.  It is low-maintenance and can stay in place for a long time.  There are no estrogen-related side effects.  It can be used when breastfeeding.  It is not associated with weight gain.  It can be inserted right after childbirth, an abortion, or a miscarriage. Advantages of a hormone IUD  If it is inserted within 7 days of your period starting, it works right after it is inserted. If the hormone IUD is inserted at any other time in your cycle, you will need to use a backup method of birth control for 7 days after insertion.  It can make menstrual periods lighter.  It can reduce menstrual cramping.  It can be used for 3-5 years. Advantages of a copper IUD  It works right after it is inserted.  It can be used as a form of emergency birth control if it is inserted within 5 days after having unprotected sex.  It does not interfere with your body's natural hormones.  It can be used for 10 years. What are the disadvantages of an IUD?  An IUD may cause irregular menstrual bleeding for a period of time after insertion.  You may have pain during insertion and have cramping and vaginal bleeding after insertion.  An IUD may  cut the uterus (uterine perforation) when it is inserted. This is rare.  An IUD may cause pelvic inflammatory disease (PID), which is an infection in the uterus and fallopian tubes. This is rare, and it usually happens during the first 20 days after the IUD is inserted.  A copper IUD can make your menstrual flow heavier and more painful. How is an IUD removed?  You will lie on your back with your knees bent and your feet in footrests (stirrups).  A device will be inserted into your vagina to spread apart the vaginal walls (speculum). This will allow your health care provider to see the strings  attached to the IUD.  Your health care provider will use a small instrument (forceps) to grasp the IUD strings and pull firmly until the IUD is removed. You may have some discomfort when the IUD is removed. Your health care provider may recommend taking over-the-counter pain relievers, such as ibuprofen, before the procedure. You may also have minor spotting for a few days after the procedure. The exact procedure for IUD removal may vary among health care providers and hospitals. Is the IUD right for me? Your health care provider will make sure you are a good candidate for an IUD and will discuss the advantages, disadvantages, and possible side effects with you. Summary  An intrauterine device (IUD) is a medical device that is inserted in the uterus to prevent pregnancy. It is a small, T-shaped device that has one or two nylon strings hanging down from it.  A hormone IUD contains the hormone progestin (synthetic progesterone). A copper IUD has copper wire wrapped around it.  Synthetic progesterone in a hormone IUD prevents pregnancy by thickening cervical mucus and thinning the walls of the uterus. Copper in a copper IUD prevents pregnancy by making the uterus and fallopian tubes produce a fluid that kills sperm.  A hormone IUD can be left in place for 3-5 years. A copper IUD can be left in place for up to 10 years.  An IUD is inserted and removed by a health care provider. You may feel some pain during insertion and removal. Your health care provider may recommend taking over-the-counter pain medicine, such as ibuprofen, before an IUD procedure. This information is not intended to replace advice given to you by your health care provider. Make sure you discuss any questions you have with your health care provider. Document Revised: 09/13/2017 Document Reviewed: 10/30/2016 Elsevier Patient Education  2020 [employer].    Contraception Choices Contraception, also called birth control, refers  to methods or devices that prevent pregnancy. Hormonal methods Contraceptive implant  A contraceptive implant is a thin, plastic tube that contains a hormone. It is inserted into the upper part of the arm. It can remain in place for up to 3 years. Progestin-only injections Progestin-only injections are injections of progestin, a synthetic form of the hormone progesterone. They are given every 3 months by a health care provider. Birth control pills  Birth control pills are pills that contain hormones that prevent pregnancy. They must be taken once a day, preferably at the same time each day. Birth control patch  The birth control patch contains hormones that prevent pregnancy. It is placed on the skin and must be changed once a week for three weeks and removed on the fourth week. A prescription is needed to use this method of contraception. Vaginal ring  A vaginal ring contains hormones that prevent pregnancy. It is placed in the vagina for three  weeks and removed on the fourth week. After that, the process is repeated with a new ring. A prescription is needed to use this method of contraception. Emergency contraceptive Emergency contraceptives prevent pregnancy after unprotected sex. They come in pill form and can be taken up to 5 days after sex. They work best the sooner they are taken after having sex. Most emergency contraceptives are available without a prescription. This method should not be used as your only form of birth control. Barrier methods Female condom  A female condom is a thin sheath that is worn over the penis during sex. Condoms keep sperm from going inside a woman's body. They can be used with a spermicide to increase their effectiveness. They should be disposed after a single use. Female condom  A female condom is a soft, loose-fitting sheath that is put into the vagina before sex. The condom keeps sperm from going inside a woman's body. They should be disposed after a single  use. Diaphragm  A diaphragm is a soft, dome-shaped barrier. It is inserted into the vagina before sex, along with a spermicide. The diaphragm blocks sperm from entering the uterus, and the spermicide kills sperm. A diaphragm should be left in the vagina for 6-8 hours after sex and removed within 24 hours. A diaphragm is prescribed and fitted by a health care provider. A diaphragm should be replaced every 1-2 years, after giving birth, after gaining more than 15 lb (6.8 kg), and after pelvic surgery. Cervical cap  A cervical cap is a round, soft latex or plastic cup that fits over the cervix. It is inserted into the vagina before sex, along with spermicide. It blocks sperm from entering the uterus. The cap should be left in place for 6-8 hours after sex and removed within 48 hours. A cervical cap must be prescribed and fitted by a health care provider. It should be replaced every 2 years. Sponge  A sponge is a soft, circular piece of polyurethane foam with spermicide on it. The sponge helps block sperm from entering the uterus, and the spermicide kills sperm. To use it, you make it wet and then insert it into the vagina. It should be inserted before sex, left in for at least 6 hours after sex, and removed and thrown away within 30 hours. Spermicides Spermicides are chemicals that kill or block sperm from entering the cervix and uterus. They can come as a cream, jelly, suppository, foam, or tablet. A spermicide should be inserted into the vagina with an applicator at least 10-15 minutes before sex to allow time for it to work. The process must be repeated every time you have sex. Spermicides do not require a prescription. Intrauterine contraception Intrauterine device (IUD) An IUD is a T-shaped device that is put in a woman's uterus. There are two types:  Hormone IUD.This type contains progestin, a synthetic form of the hormone progesterone. This type can stay in place for 3-5 years.  Copper  IUD.This type is wrapped in copper wire. It can stay in place for 10 years.  Permanent methods of contraception Female tubal ligation In this method, a woman's fallopian tubes are sealed, tied, or blocked during surgery to prevent eggs from traveling to the uterus. Hysteroscopic sterilization In this method, a small, flexible insert is placed into each fallopian tube. The inserts cause scar tissue to form in the fallopian tubes and block them, so sperm cannot reach an egg. The procedure takes about 3 months to be effective. Another form  of birth control must be used during those 3 months. Female sterilization This is a procedure to tie off the tubes that carry sperm (vasectomy). After the procedure, the man can still ejaculate fluid (semen). Natural planning methods Natural family planning In this method, a couple does not have sex on days when the woman could become pregnant. Calendar method This means keeping track of the length of each menstrual cycle, identifying the days when pregnancy can happen, and not having sex on those days. Ovulation method In this method, a couple avoids sex during ovulation. Symptothermal method This method involves not having sex during ovulation. The woman typically checks for ovulation by watching changes in her temperature and in the consistency of cervical mucus. Post-ovulation method In this method, a couple waits to have sex until after ovulation. Summary  Contraception, also called birth control, means methods or devices that prevent pregnancy.  Hormonal methods of contraception include implants, injections, pills, patches, vaginal rings, and emergency contraceptives.  Barrier methods of contraception can include female condoms, female condoms, diaphragms, cervical caps, sponges, and spermicides.  There are two types of IUDs (intrauterine devices). An IUD can be put in a woman's uterus to prevent pregnancy for 3-5 years.  Permanent sterilization can be  done through a procedure for males, females, or both.  Natural family planning methods involve not having sex on days when the woman could become pregnant. This information is not intended to replace advice given to you by your health care provider. Make sure you discuss any questions you have with your health care provider. Document Revised: 10/03/2017 Document Reviewed: 11/03/2016 Elsevier Patient Education  2020 Else[employer] Inc.   Pain Relief During Labor and Delivery Many things can cause pain during labor and delivery, including:  Pressure on bones and ligaments due to the baby moving through the pelvis.  Stretching of tissues due to the baby moving through the birth canal.  Muscle tension due to anxiety or nervousness.  The uterus tightening (contracting) and relaxing to help move the baby. There are many ways to deal with the pain of labor and delivery. They include:  Taking prenatal classes. Taking these classes helps you know what to expect during your baby's birth. What you learn will increase your confidence and decrease your anxiety.  Practicing relaxation techniques or doing relaxing activities, such as: ? Focused breathing. ? Meditation. ? Visualization. ? Aroma therapy. ? Listening to your favorite music. ? Hypnosis.  Taking a warm shower or bath (hydrotherapy). This may: ? Provide comfort and relaxation. ? Lessen your perception of pain. ? Decrease the amount of pain medicine needed. ? Decrease the length of labor.  Getting a massage or counterpressure on your back.  Applying warm packs or ice packs.  Changing positions often, moving around, or using a birthing ball.  Getting: ? Pain medicine through an IV or injection into a muscle. ? Pain medicine inserted into your spinal column. ? Injections of sterile water just under the skin on your lower back (intradermal injections). ? Laughing gas (nitrous oxide). Discuss your pain control options with your  health care provider during your prenatal visits. Explore the options offered by your hospital or birth center. What kinds of medicine are available? There are two kinds of medicines that can be used to relieve pain during labor and delivery:  Analgesics. These medicines decrease pain without causing you to lose feeling or the ability to move your muscles.  Anesthetics. These medicines block feeling in the body and can  decrease your ability to move freely. Both of these kinds of medicine can cause minor side effects, such as nausea, trouble concentrating, and sleepiness. They can also decrease the baby's heart rate before birth and affect the baby's breathing rate after birth. For this reason, health care providers are careful about when and how much medicine is given. What are specific medicines and procedures that provide pain relief? Local Anesthetics Local anesthetics are used to numb a small area of the body. They may be used along with another kind of anesthetic or used to numb the nerves of the vagina, cervix, and perineum during the second stage of labor. General Anesthetics General anesthetics cause you to lose consciousness so you do not feel pain. They are usually only used for an emergency cesarean delivery. General anesthetics are given through an IV tube and a mask. Pudendal Block A pudendal block is a form of local anesthetic. It may be used to relieve the pain associated with pushing or stretching of the perineum at the time of delivery or to further numb the perineum. A pudendal block is done by injecting numbing medicine through the vaginal wall into a nerve in the pelvis. Epidural Analgesia Epidural analgesia is given through a flexible IV catheter that is inserted into the lower back. Numbing medicine is delivered continuously to the area near your spinal column nerves (epidural space). After having this type of analgesia, you may be able to move your legs but you most likely will  not be able to walk. Depending on the amount of medicine given, you may lose all feeling in the lower half of your body, or you may retain some level of sensation, including the urge to push. Epidural analgesia can be used to provide pain relief for a vaginal birth. Spinal Block A spinal block is similar to epidural analgesia, but the medicine is injected into the spinal fluid instead of the epidural space. A spinal block is only given once. It starts to relieve pain quickly, but the pain relief lasts only 1-6 hours. Spinal blocks can be used for cesarean deliveries. Combined Spinal-Epidural (CSE) Block A CSE block combines the effects of a spinal block and epidural analgesia. The spinal block works quickly to block all pain. The epidural analgesia provides continuous pain relief, even after the effects of the spinal block have worn off. This information is not intended to replace advice given to you by your health care provider. Make sure you discuss any questions you have with your health care provider. Document Revised: 09/13/2017 Document Reviewed: 02/22/2016 Elsevier Patient Education  2020 [employer].

## 2020-01-30 LAB — HIV ANTIBODY (ROUTINE TESTING W REFLEX): HIV Screen 4th Generation wRfx: NONREACTIVE

## 2020-01-30 LAB — CBC
Hematocrit: 31.5 % — ABNORMAL LOW (ref 34.0–46.6)
Hemoglobin: 10.6 g/dL — ABNORMAL LOW (ref 11.1–15.9)
MCH: 28.3 pg (ref 26.6–33.0)
MCHC: 33.7 g/dL (ref 31.5–35.7)
MCV: 84 fL (ref 79–97)
Platelets: 315 10*3/uL (ref 150–450)
RBC: 3.74 x10E6/uL — ABNORMAL LOW (ref 3.77–5.28)
RDW: 13.1 % (ref 11.7–15.4)
WBC: 11.6 10*3/uL — ABNORMAL HIGH (ref 3.4–10.8)

## 2020-01-30 LAB — RPR: RPR Ser Ql: NONREACTIVE

## 2020-01-30 LAB — GLUCOSE TOLERANCE, 2 HOURS W/ 1HR
Glucose, 1 hour: 172 mg/dL (ref 65–179)
Glucose, 2 hour: 100 mg/dL (ref 65–152)
Glucose, Fasting: 90 mg/dL (ref 65–91)

## 2020-02-10 ENCOUNTER — Telehealth (INDEPENDENT_AMBULATORY_CARE_PROVIDER_SITE_OTHER): Payer: Medicaid Other | Admitting: Obstetrics and Gynecology

## 2020-02-10 DIAGNOSIS — O09899 Supervision of other high risk pregnancies, unspecified trimester: Secondary | ICD-10-CM | POA: Diagnosis not present

## 2020-02-10 NOTE — Progress Notes (Signed)
     TELEHEALTH VIRTUAL OBSTETRICS VISIT ENCOUNTER NOTE  Clinic: [facility]  I connected with [name] on 02/10/20 at  2:35 PM EDT by telephone at home and verified that I am speaking with the correct person using two identifiers.   I discussed the limitations, risks, security and privacy concerns of performing an evaluation and management service by telephone and the availability of in person appointments. I also discussed with the patient that there may be a patient responsible charge related to this service. The patient expressed understanding and agreed to proceed.  Subjective:  Lauren Delacruz is a 29 y.o. G3P1011 at [redacted]w[redacted]d being followed for ongoing prenatal care.  She is currently monitored for the following issues for this low-risk pregnancy and has History of gestational diabetes mellitus (GDM); Supervision of other high risk pregnancy, antepartum; Anemia in pregnancy; and Alpha thalassemia silent carrier on their problem list.  Patient reports no complaints. Reports fetal movement. Denies any contractions, bleeding or leaking of fluid.   The following portions of the patient's history were reviewed and updated as appropriate: allergies, current medications, past family history, past medical history, past social history, past surgical history and problem list.   Objective:  There were no vitals filed for this visit.  Babyscripts Data Reviewed: yes  General:  Alert, oriented and cooperative.   Mental Status: Normal mood and affect perceived. Normal judgment and thought content.  Rest of physical exam deferred due to type of encounter  Assessment and Plan:  Pregnancy: G3P1011 at [redacted]w[redacted]d 1. Supervision of other high risk pregnancy, antepartum No issues. Pt to check bp at home and upload to babyscripts. Continue iron.   Preterm labor symptoms and general obstetric precautions including but not limited to vaginal bleeding, contractions, leaking of fluid and  fetal movement were reviewed in detail with the patient.  I discussed the assessment and treatment plan with the patient. The patient was provided an opportunity to ask questions and all were answered. The patient agreed with the plan and demonstrated an understanding of the instructions. The patient was advised to call back or seek an in-person office evaluation/go to MAU at [facility] for any urgent or concerning symptoms. Please refer to After Visit Summary for other counseling recommendations.   I provided 7 minutes of non-face-to-face time during this encounter. The visit was conducted via MyChart-medicine  Return in about 3 weeks (around 03/02/2020) for 2-3wk virtual visit, low risk .  Future Appointments  Date Time Provider Department Center  02/26/2020  9:15 AM Marny Lowenstein, PA-C [facility]    [place]born Bing, MD [facility] [employer], [facility]

## 2020-02-26 ENCOUNTER — Encounter: Payer: Self-pay | Admitting: Medical

## 2020-02-26 ENCOUNTER — Telehealth (INDEPENDENT_AMBULATORY_CARE_PROVIDER_SITE_OTHER): Payer: Medicaid Other | Admitting: Medical

## 2020-02-26 VITALS — BP 129/84

## 2020-02-26 DIAGNOSIS — O99013 Anemia complicating pregnancy, third trimester: Secondary | ICD-10-CM

## 2020-02-26 DIAGNOSIS — Z3A34 34 weeks gestation of pregnancy: Secondary | ICD-10-CM

## 2020-02-26 DIAGNOSIS — O09899 Supervision of other high risk pregnancies, unspecified trimester: Secondary | ICD-10-CM

## 2020-02-26 DIAGNOSIS — D563 Thalassemia minor: Secondary | ICD-10-CM

## 2020-02-26 DIAGNOSIS — Z8632 Personal history of gestational diabetes: Secondary | ICD-10-CM

## 2020-02-26 NOTE — Progress Notes (Signed)
I connected with  Axel Filler on 02/26/20 at  9:15 AM EDT by telephone and verified that I am speaking with the correct person using two identifiers.   I discussed the limitations, risks, security and privacy concerns of performing an evaluation and management service by telephone and the availability of in person appointments. I also discussed with the patient that there may be a patient responsible charge related to this service. The patient expressed understanding and agreed to proceed.  Henrietta Dine, CMA 02/26/2020  9:21 AM

## 2020-02-26 NOTE — Patient Instructions (Addendum)
[email or web address] waterbirth class information   Fetal Movement Counts Patient Name: ________________________________________________ Patient Due Date: ____________________ What is a fetal movement count?  A fetal movement count is the number of times that you feel your baby move during a certain amount of time. This may also be called a fetal kick count. A fetal movement count is recommended for every pregnant woman. You may be asked to start counting fetal movements as early as week 28 of your pregnancy. Pay attention to when your baby is most active. You may notice your baby's sleep and wake cycles. You may also notice things that make your baby move more. You should do a fetal movement count:  When your baby is normally most active.  At the same time each day. A good time to count movements is while you are resting, after having something to eat and drink. How do I count fetal movements? 1. Find a quiet, comfortable area. Sit, or lie down on your side. 2. Write down the date, the start time and stop time, and the number of movements that you felt between those two times. Take this information with you to your health care visits. 3. Write down your start time when you feel the first movement. 4. Count kicks, flutters, swishes, rolls, and jabs. You should feel at least 10 movements. 5. You may stop counting after you have felt 10 movements, or if you have been counting for 2 hours. Write down the stop time. 6. If you do not feel 10 movements in 2 hours, contact your health care provider for further instructions. Your health care provider may want to do additional tests to assess your baby's well-being. Contact a health care provider if:  You feel fewer than 10 movements in 2 hours.  Your baby is not moving like he or she usually does. Date: ____________ Start time: ____________ Stop time: ____________ Movements: ____________ Date: ____________ Start time: ____________ Stop time:  ____________ Movements: ____________ Date: ____________ Start time: ____________ Stop time: ____________ Movements: ____________ Date: ____________ Start time: ____________ Stop time: ____________ Movements: ____________ Date: ____________ Start time: ____________ Stop time: ____________ Movements: ____________ Date: ____________ Start time: ____________ Stop time: ____________ Movements: ____________ Date: ____________ Start time: ____________ Stop time: ____________ Movements: ____________ Date: ____________ Start time: ____________ Stop time: ____________ Movements: ____________ Date: ____________ Start time: ____________ Stop time: ____________ Movements: ____________ This information is not intended to replace advice given to you by your health care provider. Make sure you discuss any questions you have with your health care provider. Document Revised: 05/21/2019 Document Reviewed: 05/21/2019 Elsevier Patient Education  2020 [employer] of the uterus can occur throughout pregnancy, but they are not always a sign that you are in labor. You may have practice contractions called Braxton Hicks contractions. These false labor contractions are sometimes confused with true labor. What are Deberah Pelton contractions? Braxton Hicks contractions are tightening movements that occur in the muscles of the uterus before labor. Unlike true labor contractions, these contractions do not result in opening (dilation) and thinning of the cervix. Toward the end of pregnancy (32-34 weeks), Braxton Hicks contractions can happen more often and may become stronger. These contractions are sometimes difficult to tell apart from true labor because they can be very uncomfortable. You should not feel embarrassed if you go to the hospital with false labor. Sometimes, the only way to tell if you are in true labor is for your health care provider to look for changes in  the cervix. The  health care provider will do a physical exam and may monitor your contractions. If you are not in true labor, the exam should show that your cervix is not dilating and your water has not broken. If there are no other health problems associated with your pregnancy, it is completely safe for you to be sent home with false labor. You may continue to have Braxton Hicks contractions until you go into true labor. How to tell the difference between true labor and false labor True labor  Contractions last 30-70 seconds.  Contractions become very regular.  Discomfort is usually felt in the top of the uterus, and it spreads to the lower abdomen and low back.  Contractions do not go away with walking.  Contractions usually become more intense and increase in frequency.  The cervix dilates and gets thinner. False labor  Contractions are usually shorter and not as strong as true labor contractions.  Contractions are usually irregular.  Contractions are often felt in the front of the lower abdomen and in the groin.  Contractions may go away when you walk around or change positions while lying down.  Contractions get weaker and are shorter-lasting as time goes on.  The cervix usually does not dilate or become thin. Follow these instructions at home:   Take over-the-counter and prescription medicines only as told by your health care provider.  Keep up with your usual exercises and follow other instructions from your health care provider.  Eat and drink lightly if you think you are going into labor.  If Braxton Hicks contractions are making you uncomfortable: ? Change your position from lying down or resting to walking, or change from walking to resting. ? Sit and rest in a tub of warm water. ? Drink enough fluid to keep your urine pale yellow. Dehydration may cause these contractions. ? Do slow and deep breathing several times an hour.  Keep all follow-up prenatal visits as told by your  health care provider. This is important. Contact a health care provider if:  You have a fever.  You have continuous pain in your abdomen. Get help right away if:  Your contractions become stronger, more regular, and closer together.  You have fluid leaking or gushing from your vagina.  You pass blood-tinged mucus (bloody show).  You have bleeding from your vagina.  You have low back pain that you never had before.  You feel your baby's head pushing down and causing pelvic pressure.  Your baby is not moving inside you as much as it used to. Summary  Contractions that occur before labor are called Braxton Hicks contractions, false labor, or practice contractions.  Braxton Hicks contractions are usually shorter, weaker, farther apart, and less regular than true labor contractions. True labor contractions usually become progressively stronger and regular, and they become more frequent.  Manage discomfort from [facility] contractions by changing position, resting in a warm bath, drinking plenty of water, or practicing deep breathing. This information is not intended to replace advice given to you by your health care provider. Make sure you discuss any questions you have with your health care provider. Document Revised: 09/13/2017 Document Reviewed: 02/14/2017 Elsevier Patient Education  [place].

## 2020-02-26 NOTE — Progress Notes (Signed)
I connected with Lauren Delacruz on 02/26/20 at  9:15 AM EDT by: MyChart for a video visit and verified that I am speaking with the correct person using two identifiers.  Patient is located at home and provider is located at [facility].     The purpose of this virtual visit is to provide medical care while limiting exposure to the novel coronavirus. I discussed the limitations, risks, security and privacy concerns of performing an evaluation and management service by MyChart and the availability of in person appointments. I also discussed with the patient that there may be a patient responsible charge related to this service. By engaging in this virtual visit, you consent to the provision of healthcare.  Additionally, you authorize for your insurance to be billed for the services provided during this visit.  The patient expressed understanding and agreed to proceed.  The following staff members participated in the virtual visit:  Jannetta Quint, CMA    PRENATAL VISIT NOTE  Subjective:  Dhara Schepp is a 29 y.o. G3P1011 at [redacted]w[redacted]d  for phone visit for ongoing prenatal care.  She is currently monitored for the following issues for this high-risk pregnancy and has History of gestational diabetes mellitus (GDM); Supervision of other high risk pregnancy, antepartum; Anemia in pregnancy; and Alpha thalassemia silent carrier on their problem list.  Patient reports occasional contractions.  Contractions: Irritability. Vag. Bleeding: None.  Movement: Present. Denies leaking of fluid.   The following portions of the patient's history were reviewed and updated as appropriate: allergies, current medications, past family history, past medical history, past social history, past surgical history and problem list.   Objective:   Vitals:   02/26/20 0925 02/26/20 0941  BP: 134/86 129/84   Self-Obtained  Fetal Status:     Movement: Present     Assessment and Plan:  Pregnancy: G3P1011 at [redacted]w[redacted]d 1. Supervision of other high  risk pregnancy, antepartum - Doing well - Considering WB, information about classes provided, will see CNM at next visit for counseling   2. Anemia during pregnancy in third trimester - Continue PO iron   3. Alpha thalassemia silent carrier  4. History of gestational diabetes mellitus (GDM) - Normal 2 hour this pregnancy   Preterm labor symptoms and general obstetric precautions including but not limited to vaginal bleeding, contractions, leaking of fluid and fetal movement were reviewed in detail with the patient.  Return in about 2 weeks (around 03/11/2020) for LOB, In-Person, with a midwife.  No future appointments.   Time spent on virtual visit: 10 minutes  Vonzella Nipple, PA-C

## 2020-02-26 NOTE — Progress Notes (Signed)
Pt states is in the process of moving & does not have access to bp cuff immediately, will have to find it & take at that time.

## 2020-03-13 ENCOUNTER — Inpatient Hospital Stay (HOSPITAL_COMMUNITY)
Admission: AD | Admit: 2020-03-13 | Discharge: 2020-03-15 | DRG: 805 | Disposition: A | Discharge status: Home / Self Care | Payer: Medicaid Other | Attending: Obstetrics and Gynecology | Admitting: Obstetrics and Gynecology

## 2020-03-13 ENCOUNTER — Encounter (HOSPITAL_COMMUNITY): Payer: Self-pay | Admitting: Obstetrics and Gynecology

## 2020-03-13 ENCOUNTER — Inpatient Hospital Stay (HOSPITAL_COMMUNITY): Payer: Medicaid Other | Admitting: Anesthesiology

## 2020-03-13 ENCOUNTER — Other Ambulatory Visit: Payer: Self-pay

## 2020-03-13 DIAGNOSIS — O42013 Preterm premature rupture of membranes, onset of labor within 24 hours of rupture, third trimester: Principal | ICD-10-CM | POA: Diagnosis present

## 2020-03-13 DIAGNOSIS — Z880 Allergy status to penicillin: Secondary | ICD-10-CM | POA: Diagnosis not present

## 2020-03-13 DIAGNOSIS — O24429 Gestational diabetes mellitus in childbirth, unspecified control: Secondary | ICD-10-CM | POA: Diagnosis not present

## 2020-03-13 DIAGNOSIS — D563 Thalassemia minor: Secondary | ICD-10-CM

## 2020-03-13 DIAGNOSIS — O9902 Anemia complicating childbirth: Secondary | ICD-10-CM | POA: Diagnosis present

## 2020-03-13 DIAGNOSIS — D509 Iron deficiency anemia, unspecified: Secondary | ICD-10-CM | POA: Diagnosis present

## 2020-03-13 DIAGNOSIS — Z87891 Personal history of nicotine dependence: Secondary | ICD-10-CM

## 2020-03-13 DIAGNOSIS — Z20822 Contact with and (suspected) exposure to covid-19: Secondary | ICD-10-CM | POA: Diagnosis present

## 2020-03-13 DIAGNOSIS — Z3A36 36 weeks gestation of pregnancy: Secondary | ICD-10-CM

## 2020-03-13 DIAGNOSIS — O99013 Anemia complicating pregnancy, third trimester: Secondary | ICD-10-CM

## 2020-03-13 DIAGNOSIS — Z3A00 Weeks of gestation of pregnancy not specified: Secondary | ICD-10-CM | POA: Diagnosis not present

## 2020-03-13 HISTORY — DX: Gestational diabetes mellitus in pregnancy, unspecified control: O24.419

## 2020-03-13 LAB — CBC
HCT: 34.9 % — ABNORMAL LOW (ref 36.0–46.0)
Hemoglobin: 10.9 g/dL — ABNORMAL LOW (ref 12.0–15.0)
MCH: 28 pg (ref 26.0–34.0)
MCHC: 31.2 g/dL (ref 30.0–36.0)
MCV: 89.7 fL (ref 80.0–100.0)
Platelets: 340 10*3/uL (ref 150–400)
RBC: 3.89 MIL/uL (ref 3.87–5.11)
RDW: 14.4 % (ref 11.5–15.5)
WBC: 12.6 10*3/uL — ABNORMAL HIGH (ref 4.0–10.5)
nRBC: 0 % (ref 0.0–0.2)

## 2020-03-13 LAB — TYPE AND SCREEN
ABO/RH(D): O POS
Antibody Screen: NEGATIVE

## 2020-03-13 LAB — GROUP B STREP BY PCR: Group B strep by PCR: NEGATIVE

## 2020-03-13 LAB — SARS CORONAVIRUS 2 BY RT PCR (HOSPITAL ORDER, PERFORMED IN ~~LOC~~ HOSPITAL LAB): SARS Coronavirus 2: NEGATIVE

## 2020-03-13 LAB — RPR: RPR Ser Ql: NONREACTIVE

## 2020-03-13 LAB — ABO/RH: ABO/RH(D): O POS

## 2020-03-13 LAB — POCT FERN TEST: POCT Fern Test: POSITIVE

## 2020-03-13 MED ORDER — WITCH HAZEL-GLYCERIN EX PADS: 1.0000 "application " | MEDICATED_PAD | CUTANEOUS | Status: DC | PRN

## 2020-03-13 MED ORDER — LACTATED RINGERS IV SOLN: 500.0000 mL | INTRAVENOUS | Status: DC | PRN

## 2020-03-13 MED ORDER — LACTATED RINGERS IV SOLN: INTRAVENOUS | Status: DC

## 2020-03-13 MED ORDER — BETAMETHASONE SOD PHOS & ACET 6 (3-3) MG/ML IJ SUSP
12.0000 mg | INTRAMUSCULAR | Status: DC
  Administered 2020-03-13: 12 mg via INTRAMUSCULAR
  Filled 2020-03-13: qty 5
  Filled 2020-03-13: qty 2

## 2020-03-13 MED ORDER — FENTANYL-BUPIVACAINE-NACL 0.5-0.125-0.9 MG/250ML-% EP SOLN
12.0000 mL/h | EPIDURAL | Status: DC | PRN
  Filled 2020-03-13: qty 250

## 2020-03-13 MED ORDER — ONDANSETRON HCL 4 MG/2ML IJ SOLN: 4.0000 mg | INTRAMUSCULAR | Status: DC | PRN

## 2020-03-13 MED ORDER — DIPHENHYDRAMINE HCL 50 MG/ML IJ SOLN: 12.5000 mg | INTRAMUSCULAR | Status: DC | PRN

## 2020-03-13 MED ORDER — CLINDAMYCIN PHOSPHATE 900 MG/50ML IV SOLN: 900.0000 mg | Freq: Three times a day (TID) | INTRAVENOUS | Status: DC

## 2020-03-13 MED ORDER — ZOLPIDEM TARTRATE 5 MG PO TABS: 5.0000 mg | ORAL_TABLET | Freq: Every evening | ORAL | Status: DC | PRN

## 2020-03-13 MED ORDER — PHENYLEPHRINE 40 MCG/ML (10ML) SYRINGE FOR IV PUSH (FOR BLOOD PRESSURE SUPPORT): 80.0000 ug | PREFILLED_SYRINGE | INTRAVENOUS | Status: DC | PRN

## 2020-03-13 MED ORDER — FENTANYL-BUPIVACAINE-NACL 0.5-0.125-0.9 MG/250ML-% EP SOLN: 12.0000 mL/h | EPIDURAL | Status: DC | PRN

## 2020-03-13 MED ORDER — LIDOCAINE HCL (PF) 1 % IJ SOLN: 30.0000 mL | INTRAMUSCULAR | Status: DC | PRN

## 2020-03-13 MED ORDER — ONDANSETRON HCL 4 MG/2ML IJ SOLN: 4.0000 mg | Freq: Four times a day (QID) | INTRAMUSCULAR | Status: DC | PRN

## 2020-03-13 MED ORDER — BENZOCAINE-MENTHOL 20-0.5 % EX AERO: 1.0000 "application " | INHALATION_SPRAY | CUTANEOUS | Status: DC | PRN

## 2020-03-13 MED ORDER — FENTANYL CITRATE (PF) 100 MCG/2ML IJ SOLN
100.0000 ug | INTRAMUSCULAR | Status: DC | PRN
  Administered 2020-03-13: 100 ug via INTRAVENOUS
  Filled 2020-03-13: qty 2

## 2020-03-13 MED ORDER — FLEET ENEMA 7-19 GM/118ML RE ENEM: 1.0000 | ENEMA | RECTAL | Status: DC | PRN

## 2020-03-13 MED ORDER — SOD CITRATE-CITRIC ACID 500-334 MG/5ML PO SOLN: 30.0000 mL | ORAL | Status: DC | PRN

## 2020-03-13 MED ORDER — LACTATED RINGERS IV SOLN
500.0000 mL | Freq: Once | INTRAVENOUS | Status: AC
  Administered 2020-03-13: 500 mL via INTRAVENOUS

## 2020-03-13 MED ORDER — DIBUCAINE (PERIANAL) 1 % EX OINT: 1.0000 "application " | TOPICAL_OINTMENT | CUTANEOUS | Status: DC | PRN

## 2020-03-13 MED ORDER — OXYCODONE-ACETAMINOPHEN 5-325 MG PO TABS: 2.0000 | ORAL_TABLET | ORAL | Status: DC | PRN

## 2020-03-13 MED ORDER — SENNOSIDES-DOCUSATE SODIUM 8.6-50 MG PO TABS
2.0000 | ORAL_TABLET | ORAL | Status: DC
  Administered 2020-03-14 – 2020-03-15 (×2): 2 via ORAL
  Filled 2020-03-13 (×2): qty 2

## 2020-03-13 MED ORDER — OXYCODONE-ACETAMINOPHEN 5-325 MG PO TABS: 1.0000 | ORAL_TABLET | ORAL | Status: DC | PRN

## 2020-03-13 MED ORDER — OXYTOCIN 40 UNITS IN NORMAL SALINE INFUSION - SIMPLE MED
2.5000 [IU]/h | INTRAVENOUS | Status: DC
  Filled 2020-03-13: qty 1000

## 2020-03-13 MED ORDER — SIMETHICONE 80 MG PO CHEW: 80.0000 mg | CHEWABLE_TABLET | ORAL | Status: DC | PRN

## 2020-03-13 MED ORDER — PRENATAL MULTIVITAMIN CH
1.0000 | ORAL_TABLET | Freq: Every day | ORAL | Status: DC
  Administered 2020-03-13 – 2020-03-15 (×3): 1 via ORAL
  Filled 2020-03-13 (×3): qty 1

## 2020-03-13 MED ORDER — EPHEDRINE 5 MG/ML INJ: 10.0000 mg | INTRAVENOUS | Status: DC | PRN

## 2020-03-13 MED ORDER — LIDOCAINE HCL (PF) 1 % IJ SOLN
INTRAMUSCULAR | Status: DC | PRN
  Administered 2020-03-13: 6 mL via EPIDURAL

## 2020-03-13 MED ORDER — IBUPROFEN 600 MG PO TABS
600.0000 mg | ORAL_TABLET | Freq: Four times a day (QID) | ORAL | Status: DC
  Administered 2020-03-13 – 2020-03-15 (×8): 600 mg via ORAL
  Filled 2020-03-13 (×8): qty 1

## 2020-03-13 MED ORDER — SODIUM CHLORIDE (PF) 0.9 % IJ SOLN
INTRAMUSCULAR | Status: DC | PRN
  Administered 2020-03-13: 12 mL/h via EPIDURAL

## 2020-03-13 MED ORDER — TETANUS-DIPHTH-ACELL PERTUSSIS 5-2.5-18.5 LF-MCG/0.5 IM SUSP: 0.5000 mL | Freq: Once | INTRAMUSCULAR | Status: DC

## 2020-03-13 MED ORDER — PENICILLIN G POT IN DEXTROSE 60000 UNIT/ML IV SOLN: 3.0000 10*6.[IU] | INTRAVENOUS | Status: DC

## 2020-03-13 MED ORDER — SODIUM CHLORIDE 0.9 % IV SOLN: 5.0000 10*6.[IU] | Freq: Once | INTRAVENOUS | Status: DC

## 2020-03-13 MED ORDER — OXYTOCIN BOLUS FROM INFUSION
500.0000 mL | Freq: Once | INTRAVENOUS | Status: AC
  Administered 2020-03-13: 500 mL via INTRAVENOUS

## 2020-03-13 MED ORDER — COCONUT OIL OIL: 1.0000 "application " | TOPICAL_OIL | Status: DC | PRN

## 2020-03-13 MED ORDER — ACETAMINOPHEN 325 MG PO TABS: 650.0000 mg | ORAL_TABLET | ORAL | Status: DC | PRN

## 2020-03-13 MED ORDER — ACETAMINOPHEN 325 MG PO TABS
650.0000 mg | ORAL_TABLET | ORAL | Status: DC | PRN
  Administered 2020-03-13 – 2020-03-14 (×3): 650 mg via ORAL
  Filled 2020-03-13 (×3): qty 2

## 2020-03-13 MED ORDER — ONDANSETRON HCL 4 MG PO TABS: 4.0000 mg | ORAL_TABLET | ORAL | Status: DC | PRN

## 2020-03-13 MED ORDER — DIPHENHYDRAMINE HCL 25 MG PO CAPS: 25.0000 mg | ORAL_CAPSULE | Freq: Four times a day (QID) | ORAL | Status: DC | PRN

## 2020-03-13 NOTE — Discharge Summary (Addendum)
Postpartum Discharge Summary  Date of Service updated     Patient Name: Lauren Delacruz DOB: 1990-10-24 MRN: [id number]  Date of admission: 03/13/2020 Delivery date:03/13/2020  Delivering provider: Bonnielee Haff K  Date of discharge: 03/14/2020  Admitting diagnosis: Preterm premature rupture of membranes (PPROM) with onset of labor within 24 hours of rupture in third trimester, antepartum [O42.013] Intrauterine pregnancy: [redacted]w[redacted]d    Secondary diagnosis:  Active Problems:   Preterm premature rupture of membranes (PPROM) with onset of labor within 24 hours of rupture in third trimester, antepartum   SVD (spontaneous vaginal delivery)  Additional problems: anemia, alpha thal trait    Discharge diagnosis: Preterm Pregnancy Delivered                                              Post partum procedures:none Augmentation: N/A Complications: None  Hospital course: Onset of Labor With Vaginal Delivery      29y.o. yo [id number] admitted in Latent Labor on 03/13/2020. Patient had an uncomplicated labor course as follows:  Membrane Rupture Time/Date: 12:20 AM ,03/13/2020   Delivery Method:Vaginal, Spontaneous  Episiotomy: None  Lacerations:  None  Patient had an uncomplicated postpartum course.  She is ambulating, tolerating a regular diet, passing flatus, and urinating well. Patient is discharged home in stable condition on 03/14/20.  Newborn Data: Birth date:03/13/2020  Birth time:8:37 AM  Gender:Female  Living status:Living  Apgars:8 ,9  Weight:2720 g   Magnesium Sulfate received: No BMZ received: Yes Rhophylac:N/A MMR:N/A T-DaP:Given prenatally Flu: No Transfusion:No  Physical exam  Vitals:   03/13/20 1520 03/13/20 1920 03/13/20 2230 03/14/20 0500  BP: 129/83 129/78 135/78 119/80  Pulse: 96 98 85 97  Resp: 16 18 18 18   Temp: 98.5 F (36.9 C) 98.1 F (36.7 C) 97.8 F (36.6 C) 97.9 F (36.6 C)  TempSrc: Oral Oral Oral Oral  SpO2: 99% 100% 100% 100%   General:  alert, cooperative and no distress Lochia: appropriate Uterine Fundus: firm Incision: N/A DVT Evaluation: No evidence of DVT seen on physical exam. Labs: Lab Results  Component Value Date   WBC 20.4 (H) 03/14/2020   HGB 9.4 (L) 03/14/2020   HCT 29.4 (L) 03/14/2020   MCV 87.0 03/14/2020   PLT 305 03/14/2020   No flowsheet data found. Edinburgh Score: Edinburgh Postnatal Depression Scale Screening Tool 03/13/2020  I have been able to laugh and see the funny side of things. (No Data)     After visit meds:  Allergies as of 03/14/2020      Reactions   Penicillins    Mother told her she was allergic, has taken Amoxicillin w/o problem      Medication List    TAKE these medications   Blood Pressure Kit Devi 1 Device by Does not apply route as needed.   ferrous sulfate 325 (65 FE) MG tablet Take 325 mg by mouth daily with breakfast.   ibuprofen 600 MG tablet Commonly known as: ADVIL Take 1 tablet (600 mg total) by mouth every 6 (six) hours.   multivitamin-prenatal 27-0.8 MG Tabs tablet Take 1 tablet by mouth daily at 12 noon.      Discharge home in stable condition Infant Feeding: Breast Infant Disposition:home with mother Discharge instruction: per After Visit Summary and Postpartum booklet. Activity: Advance as tolerated. Pelvic rest for 6 weeks.  Diet: routine  diet Future Appointments: Future Appointments  Date Time Provider [place]  03/16/2020  2:55 PM Danielle Rankin [facility]   Follow up Visit: [place] [facility]. Schedule an appointment as soon as possible for a visit in 4 week(s).   Specialty: Obstetrics and Gynecology Contact information: [place] [phone]         Please schedule this patient for a Virtual postpartum visit in 6 weeks with the following provider: Any provider Additional Postpartum F/U:none  Low risk  pregnancy complicated by: n/a Delivery mode:  Vaginal, Spontaneous  Anticipated Birth Control:  Depo   03/14/2020 Hansel Feinstein, CNM

## 2020-03-13 NOTE — Anesthesia Preprocedure Evaluation (Signed)
Anesthesia Evaluation  Patient identified by MRN, date of birth, ID band Patient awake    Reviewed: Allergy & Precautions, H&P , NPO status , Patient's Chart, lab work & pertinent test results, reviewed documented beta blocker date and time   Airway Mallampati: II  TM Distance: >3 FB Neck ROM: full    Dental no notable dental hx. (+) Teeth Intact   Pulmonary neg pulmonary ROS, former smoker,    Pulmonary exam normal breath sounds clear to auscultation       Cardiovascular negative cardio ROS Normal cardiovascular exam Rhythm:regular Rate:Normal     Neuro/Psych negative neurological ROS  negative psych ROS   GI/Hepatic negative GI ROS, Neg liver ROS,   Endo/Other  negative endocrine ROSdiabetes  Renal/GU negative Renal ROS  negative genitourinary   Musculoskeletal   Abdominal   Peds  Hematology  (+) Blood dyscrasia, anemia ,   Anesthesia Other Findings   Reproductive/Obstetrics (+) Pregnancy                             Anesthesia Physical Anesthesia Plan  ASA: II  Anesthesia Plan: Epidural   Post-op Pain Management:    Induction:   PONV Risk Score and Plan:   Airway Management Planned:   Additional Equipment:   Intra-op Plan:   Post-operative Plan:   Informed Consent: I have reviewed the patients History and Physical, chart, labs and discussed the procedure including the risks, benefits and alternatives for the proposed anesthesia with the patient or authorized representative who has indicated his/her understanding and acceptance.     Dental Advisory Given  Plan Discussed with: Anesthesiologist  Anesthesia Plan Comments: (Labs checked- platelets confirmed with RN in room. Fetal heart tracing, per RN, reported to be stable enough for sitting procedure. Discussed epidural, and patient consents to the procedure:  included risk of possible headache,backache, failed block,  allergic reaction, and nerve injury. This patient was asked if she had any questions or concerns before the procedure started.)        Anesthesia Quick Evaluation

## 2020-03-13 NOTE — Anesthesia Postprocedure Evaluation (Signed)
Anesthesia Post Note  Patient: Lauren Delacruz  Procedure(s) Performed: AN AD HOC LABOR EPIDURAL     Patient location during evaluation: Mother Baby Anesthesia Type: Epidural Level of consciousness: awake and alert and oriented Pain management: satisfactory to patient Vital Signs Assessment: post-procedure vital signs reviewed and stable Respiratory status: respiratory function stable Cardiovascular status: stable Postop Assessment: no headache, no backache, epidural receding, patient able to bend at knees, no signs of nausea or vomiting, adequate PO intake, no apparent nausea or vomiting and able to ambulate Anesthetic complications: no    Last Vitals:  Vitals:   03/13/20 0945 03/13/20 1020  BP: 91/76 (!) 141/86  Pulse: 93 90  Resp: 18 16  Temp:  36.4 C  SpO2:  100%    Last Pain:  Vitals:   03/13/20 1020  TempSrc: Axillary  PainSc: 0-No pain   Pain Goal:                   Mckay Tegtmeyer

## 2020-03-13 NOTE — MAU Note (Signed)
Pt reports to MAU for contractions and ROM. She states her water broke at 0020, clear fluid and that she has had several gushes. endorses movement, denies VB.

## 2020-03-13 NOTE — Anesthesia Procedure Notes (Signed)
Epidural Patient location during procedure: OB Start time: 03/13/2020 5:38 AM End time: 03/13/2020 5:42 AM  Staffing Anesthesiologist: Bethena Midget, MD  Preanesthetic Checklist Completed: patient identified, IV checked, site marked, risks and benefits discussed, surgical consent, monitors and equipment checked, pre-op evaluation and timeout performed  Epidural Patient position: sitting Prep: DuraPrep and site prepped and draped Patient monitoring: continuous pulse ox and blood pressure Approach: midline Location: L4-L5 Injection technique: LOR air  Needle:  Needle type: Tuohy  Needle gauge: 17 G Needle length: 9 cm and 9 Needle insertion depth: 5 cm cm Catheter type: closed end flexible Catheter size: 19 Gauge Catheter at skin depth: 10 cm Test dose: negative  Assessment Events: blood not aspirated, injection not painful, no injection resistance, no paresthesia and negative IV test

## 2020-03-13 NOTE — H&P (Addendum)
OBSTETRIC ADMISSION HISTORY AND PHYSICAL  Lauren Delacruz is a 29 y.o. female G75P1011 with IUP at 44w5dby Early UKore[place]resenting for preterm premature rupture of membranes at 041 She reports +FMs, no VB, no blurry vision, headaches or peripheral edema, and RUQ pain.  She plans on breast feeding. She request depoprovera at postpartum visit as outpatient for birth control. [facility]   Dating: By Early U/S --->  Estimated Date of Delivery: 04/05/20  Sono:    @[redacted]w[redacted]d , CWD, normal anatomy, cephalic presentation, anterior placenta, 298g, 60% EFW   Nursing Staff Provider  Office Location [place] Dating   early u/s  Language  English Anatomy [place] scheduled  Flu Vaccine  declines Genetic Screen  NIPS: low risk female    TDaP vaccine  01/11/20 Hgb A1C or  GTT Early: 5.5 Third trimester: normal 2hr  Rhogam  n/a   LAB RESULTS   Feeding Plan breast Blood Type O+  Contraception Depo vs IPP Paragard Antibody Negative (01/04 1050)  Circumcision Girl Rubella 3.13 (01/04 1050)  Pediatrician  BTera Partridge MD RPR Non Reactive (01/04 1050)   Support Person [place]  HBsAg Negative (01/04 1050)   Prenatal Classes No HIV Non Reactive (01/04 1050)  BTL Consent NA GBS  (For PCN allergy, check sensitivities)   VBAC Consent  n/a Pap  2021    Hgb Electro    BP Cuff Has BP cuff CF     SMA     Waterbirth  [ ]  Class [ ]  Consent [ ]  CNM visit      Prenatal History/Complications:  - History of GDM, normal 2 hr this pregnancy - Anemia of pregnancy - Alpha thalassemia silent carrier   Past Medical History: Past Medical History:  Diagnosis Date   Gestational diabetes    History of gestational diabetes mellitus (GDM)    History of ovarian cyst     Past Surgical History: History reviewed. No pertinent surgical history.  Obstetrical History: OB History     Gravida  3   Para  1   Term  1   Preterm  0   AB  1   Living  1      SAB  0   TAB  1   Ectopic  0   Multiple   0   Live Births  1           Social History Social History   Socioeconomic History   Marital status: Single    Spouse name: Not on file   Number of children: Not on file   Years of education: Not on file   Highest education level: Not on file  Occupational History   Not on file  Tobacco Use   Smoking status: Former Smoker   Smokeless tobacco: Never Used   Tobacco comment: socially  Substance and Sexual Activity   Alcohol use: Not Currently    Comment: socially   Drug use: Never   Sexual activity: Yes    Partners: Male    Birth control/protection: None  Other Topics Concern   Not on file  Social History Narrative   Not on file   Social Determinants of Health   Financial Resource Strain:    Difficulty of Paying Living Expenses:   Food Insecurity: No Food Insecurity   Worried About Running Out of Food in the Last Year: Never true   RMandareein the Last Year: Never true  Transportation  Needs: No Transportation Needs   Lack of Transportation (Medical): No   Lack of Transportation (Non-Medical): No  Physical Activity:    Days of Exercise per Week:    Minutes of Exercise per Session:   Stress:    Feeling of Stress :   Social Connections:    Frequency of Communication with Friends and Family:    Frequency of Social Gatherings with Friends and Family:    Attends Religious Services:    Active Member of Clubs or Organizations:    Attends Music therapist:    Marital Status:     Family History: Family History  Problem Relation Age of Onset   Hypertension Mother    Hypertension Father    Prostate cancer Father    Breast cancer Paternal Aunt    Hypertension Maternal Grandmother     Allergies: Allergies  Allergen Reactions   Penicillins     Medications Prior to Admission  Medication Sig Dispense Refill Last Dose   ferrous sulfate 325 (65 FE) MG tablet Take 325 mg by mouth daily with breakfast.   03/12/2020 at Unknown time   Prenatal  Vit-Fe Fumarate-FA (MULTIVITAMIN-PRENATAL) 27-0.8 MG TABS tablet Take 1 tablet by mouth daily at 12 noon.   03/12/2020 at Unknown time   Blood Pressure Monitoring (BLOOD PRESSURE KIT) DEVI 1 Device by Does not apply route as needed. 1 each 0      Review of Systems   All systems reviewed and negative except as stated in HPI  Blood pressure 117/74, pulse (!) 102, temperature 98.4 F (36.9 C), temperature source Oral, resp. rate 16, last menstrual period 06/30/2019, SpO2 100 %. General appearance: alert and cooperative Lungs: breathing comfortably on RA Heart: regular rate and rhythm Abdomen: gravid Pelvic: not examined Extremities: no sign of DVT Presentation: cephalic by cervical exam in MAU Fetal monitoring 135 bpm /moderate variability/+ accels, no decels Uterine activity q3-4 min Dilation: 2.5 Effacement (%): 60 Exam by:: Jorje Guild, NP   Prenatal labs: ABO, Rh: --/--/O POS, O POS Performed at [facility], [address] [place], [place] [address]  [phone]) Antibody: NEG (05/30 9470) Rubella: 3.13 (01/04 1050) RPR: Non Reactive (04/16 0900)  HBsAg: Negative (01/04 1050)  HIV: Non Reactive (04/16 0900)  GBS: NEGATIVE/-- (05/30 0419) Early 1 hr Glucola normal, 145 2 hr Glucola normal, 90/172/100 Genetic screening  NIPS low risk Anatomy US normal  Prenatal Transfer Tool  Maternal Diabetes: No Genetic Screening: Normal Maternal Ultrasounds/Referrals: Normal Fetal Ultrasounds or other Referrals:  None Maternal Substance Abuse:  No Significant Maternal Medications:  None Significant Maternal Lab Results: GBS negative  Results for orders placed or performed during the hospital encounter of 03/13/20 (from the past 24 hour(s))  POCT fern test   Collection Time: 03/13/20  3:40 AM  Result Value Ref Range   POCT Fern Test Positive = ruptured amniotic membanes   SARS Coronavirus 2 by RT PCR (hospital order, performed in [place] hospital lab) Nasopharyngeal  Nasopharyngeal Swab   Collection Time: 03/13/20  4:18 AM   Specimen: Nasopharyngeal Swab  Result Value Ref Range   SARS Coronavirus 2 NEGATIVE NEGATIVE  Group B strep by PCR   Collection Time: 03/13/20  4:19 AM   Specimen: Vaginal/Rectal; Genital  Result Value Ref Range   Group B strep by PCR NEGATIVE NEGATIVE  CBC   Collection Time: 03/13/20  4:36 AM  Result Value Ref Range   WBC 12.6 (H) 4.0 - 10.5 K/uL  RBC 3.89 3.87 - 5.11 MIL/uL   Hemoglobin 10.9 (L) 12.0 - 15.0 g/dL   HCT 34.9 (L) 36.0 - 46.0 %   MCV 89.7 80.0 - 100.0 fL   MCH 28.0 26.0 - 34.0 pg   MCHC 31.2 30.0 - 36.0 g/dL   RDW 14.4 11.5 - 15.5 %   Platelets 340 150 - 400 K/uL   nRBC 0.0 0.0 - 0.2 %  Type and screen [place]   Collection Time: 03/13/20  4:36 AM  Result Value Ref Range   ABO/RH(D) O POS    Antibody Screen NEG    Sample Expiration      03/16/2020,2359 Performed at [facility], [place] [address], [place], [place] [address]   ABO/Rh   Collection Time: 03/13/20  4:36 AM  Result Value Ref Range   ABO/RH(D)      O POS Performed at [facility] [address] [place], [place] [address]     Patient Active Problem List   Diagnosis Date Noted   Preterm premature rupture of membranes (PPROM) with onset of labor within 24 hours of rupture in third trimester, antepartum 03/13/2020   Alpha thalassemia silent carrier 11/10/2019   Anemia in pregnancy 10/20/2019   Supervision of other high risk pregnancy, antepartum 10/19/2019   History of gestational diabetes mellitus (GDM)     Assessment/Plan:  Lauren Delacruz is a 29 y.o. G3P1011 at 90w5dhere for PPROM with positive [place].    #Labor: Admit to [facility]  Routine Labor and Delivery Orders per protocol.  Will order BMZ x2. #Pain: Epidural in place #FWB: Category I #ID: GBS negative #MOF: breast #MOC: depoprovera at postpartum visit #Circ:  NWilton DO  03/13/2020, 6:04  AM  Attestation:  I confirm that I have verified the information documented in the resident's note and that I have also personally reperformed the physical exam and all medical decision making activities.  The patient was seen and examined by me also Agree with note NST reactive and reassuring UCs as listed Cervical exams as listed in note  WSeabron Spates CNM

## 2020-03-14 LAB — CBC
HCT: 29.4 % — ABNORMAL LOW (ref 36.0–46.0)
Hemoglobin: 9.4 g/dL — ABNORMAL LOW (ref 12.0–15.0)
MCH: 27.8 pg (ref 26.0–34.0)
MCHC: 32 g/dL (ref 30.0–36.0)
MCV: 87 fL (ref 80.0–100.0)
Platelets: 305 10*3/uL (ref 150–400)
RBC: 3.38 MIL/uL — ABNORMAL LOW (ref 3.87–5.11)
RDW: 14.3 % (ref 11.5–15.5)
WBC: 20.4 10*3/uL — ABNORMAL HIGH (ref 4.0–10.5)
nRBC: 0 % (ref 0.0–0.2)

## 2020-03-14 MED ORDER — IBUPROFEN 600 MG PO TABS: 600.0000 mg | ORAL_TABLET | Freq: Four times a day (QID) | ORAL | 0 refills | Status: DC

## 2020-03-14 NOTE — Discharge Instructions (Signed)

## 2020-03-14 NOTE — Lactation Note (Signed)
This note was copied from a baby's chart. Lactation Consultation Note  Patient Name: Lauren Delacruz Date: 03/14/2020 Reason for consult: Initial assessment;Late-preterm 34-36.6wks;Infant < 6lbs;Infant weight loss;Other (Comment)(mom aware to call for [facility] for latch assessment)  Baby is 45 hours old  As LC entered the room mom was finishing up feed the baby supplementing and per mom the  Baby really doesn't like the formula.  Per mom has been pumping and not getting milk yet,.  Mom reported she fed her 1st abby 16 months ( now 29 years old ) and had + changes with breast during pregnancy.  Mom reviewed the LPT recommended feeding plan and mom already following it.  LC stressed the importance of consistent pumping , feeding with cues STS and by 3 hours  Due to the baby's weight lower than 6 pounds and LPT.  DEBP has been set up by the [facility].  LC provided the LPT information sheet and the [facility] pamphlet.  Mom aware to call for LC to assess feeding.   Maternal Data Has patient been taught Hand Expression?: Yes Does the patient have breastfeeding experience prior to this delivery?: Yes  Feeding Feeding Type: (baby recently fed at the breast and supplemented per mom) Nipple Type: Nfant Slow Flow (purple)  LATCH Score                   Interventions Interventions: Breast feeding basics reviewed  Lactation Tools Discussed/Used Tools: Pump Breast pump type: Double-Electric Breast Pump   Consult Status Consult Status: Follow-up Date: 03/14/20 Follow-up type: In-patient    Lauren Delacruz 03/14/2020, 3:40 PM

## 2020-03-14 NOTE — Lactation Note (Signed)
This note was copied from a baby's chart. Lactation Consultation Note  Patient Name: Lauren Delacruz [id number] Date: 03/14/2020 Reason for consult: Follow-up assessment;Late-preterm 34-36.6wks;Infant < 6lbs;Infant weight loss  Visited with mom of a 37 hours old LPI female < 6 lbs, she told LC she hasn't been pumping like she should today but she was excited to report that she just had her best BF session ever with her baby. She nursed for 20 minutes before LC came in the room and she was asleep on her bassinet upon arrival. Asked mom to call for assistance when needed.  Per mom feedings at the breast are comfortable and she could feel that "baby got something" this time; she's experienced and BF her first child for 16 months. She also told LC that her milk is now coming in and she can feel her breasts are getting heavy and lumpy and she's finally excited about pumping, she wasn't yesterday because she wasn't getting anything; praised her for her efforts.  Reviewed normal LPI behavior, supply and demand, lactogenesis II, engorgement prevention, pumping schedule and formula supplementation guidelines. Mom told LC that baby is still not drinking the Similac 22 calorie formula "as she should" only taking about 2-4 ml per feeding. Mom will start supplementing with her EBM now that is available.  Feeding plan:  1. Encouraged mom to feed baby STS 8-12 times/24 hours or sooner if feeding cues are present 2. She'll start pumping consistently every 3 hours. Mom is aware that pumping after feeding will continue after hospital discharge for at least another 4-6 weeks until baby is fully caught up weight wise and developmentally 3. She'll continue supplementing with her EBM/formula every 3 hours after feedings at the breast  No support person in mom's room at the time of [facility] consultation. Mom reported all questions and concerns were answered, she's aware of LC OP services and will call PRN.    Maternal Data    Feeding Feeding Type: Breast Fed  LATCH Score                   Interventions Interventions: Breast feeding basics reviewed;DEBP  Lactation Tools Discussed/Used Tools: Pump Breast pump type: Double-Electric Breast Pump   Consult Status Consult Status: Follow-up Date: 03/15/20 Follow-up type: In-patient    Lauren Delacruz 03/14/2020, 10:23 PM

## 2020-03-15 NOTE — Lactation Note (Signed)
This note was copied from a baby's chart. Lactation Consultation Note  Patient Name: Lauren Delacruz [id number] Date: 03/15/2020 Reason for consult: Follow-up assessment;Infant weight loss;Infant < 6lbs;Late-preterm 34-36.6wks Baby is 49 hours old/8% weight loss.  Mom feels breasts are fuller this morning.  Baby is latching easily and mom is post pumping.  She recently pumped 15 mls and baby took this well using a slow flow nipple.  Mom will have a DEBP at home.  Instructed to continue post pumping and supplementing.  Discussed milk coming to volume and the prevention and treatment of engorgement.  Encouraged good breast massage prior to and during feeding.  Reviewed outpatient services and encouraged to call prn.  Maternal Data    Feeding    LATCH Score                   Interventions    Lactation Tools Discussed/Used     Consult Status Consult Status: Complete Follow-up type: Call as needed    Huston Foley 03/15/2020, 10:07 AM

## 2020-03-16 ENCOUNTER — Encounter: Payer: Medicaid Other | Admitting: Medical

## 2020-03-16 LAB — CULTURE, BETA STREP (GROUP B ONLY)

## 2020-04-05 ENCOUNTER — Inpatient Hospital Stay (HOSPITAL_COMMUNITY): Admission: RE | Admit: 2020-04-05 | Payer: Medicaid Other | Source: Home / Self Care

## 2020-04-13 DIAGNOSIS — H1013 Acute atopic conjunctivitis, bilateral: Secondary | ICD-10-CM | POA: Diagnosis not present

## 2020-04-13 DIAGNOSIS — H5213 Myopia, bilateral: Secondary | ICD-10-CM | POA: Diagnosis not present

## 2020-04-14 ENCOUNTER — Ambulatory Visit: Payer: Medicaid Other | Admitting: Medical

## 2020-05-10 ENCOUNTER — Encounter: Payer: Self-pay | Admitting: Student

## 2020-05-10 ENCOUNTER — Other Ambulatory Visit: Payer: Self-pay

## 2020-05-10 ENCOUNTER — Ambulatory Visit (INDEPENDENT_AMBULATORY_CARE_PROVIDER_SITE_OTHER): Payer: Medicaid Other | Admitting: Student

## 2020-05-10 VITALS — BP 127/85 | HR 76 | Ht 64.0 in | Wt 195.6 lb

## 2020-05-10 DIAGNOSIS — Z3042 Encounter for surveillance of injectable contraceptive: Secondary | ICD-10-CM

## 2020-05-10 DIAGNOSIS — Z30013 Encounter for initial prescription of injectable contraceptive: Secondary | ICD-10-CM

## 2020-05-10 DIAGNOSIS — Z3202 Encounter for pregnancy test, result negative: Secondary | ICD-10-CM | POA: Diagnosis not present

## 2020-05-10 DIAGNOSIS — O09899 Supervision of other high risk pregnancies, unspecified trimester: Secondary | ICD-10-CM

## 2020-05-10 LAB — POCT PREGNANCY, URINE: Preg Test, Ur: NEGATIVE

## 2020-05-10 MED ORDER — MEDROXYPROGESTERONE ACETATE 150 MG/ML IM SUSP
150.0000 mg | Freq: Once | INTRAMUSCULAR | Status: AC
  Administered 2020-05-10: 150 mg via INTRAMUSCULAR

## 2020-05-10 NOTE — Patient Instructions (Addendum)
You may safely return to gentle exercise. Take it slow and listen to your body. You may also safely return to sexual intercourse.   You will need your next Depo Provera injection in 3 months. Please schedule your next injection near 08/03/2020  Medroxyprogesterone injection [Contraceptive] What is this medicine? MEDROXYPROGESTERONE (me DROX ee proe JES te rone) contraceptive injections prevent pregnancy. They provide effective birth control for 3 months. Depo-subQ Provera 104 is also used for treating pain related to endometriosis. This medicine may be used for other purposes; ask your health care provider or pharmacist if you have questions. COMMON BRAND NAME(S): Depo-Provera, Depo-subQ Provera 104 What should I tell my health care provider before I take this medicine? They need to know if you have any of these conditions:  frequently drink alcohol  asthma  blood vessel disease or a history of a blood clot in the lungs or legs  bone disease such as osteoporosis  breast cancer  diabetes  eating disorder (anorexia nervosa or bulimia)  high blood pressure  HIV infection or AIDS  kidney disease  liver disease  mental depression  migraine  seizures (convulsions)  stroke  tobacco smoker  vaginal bleeding  an unusual or allergic reaction to medroxyprogesterone, other hormones, medicines, foods, dyes, or preservatives  pregnant or trying to get pregnant  breast-feeding How should I use this medicine? Depo-Provera Contraceptive injection is given into a muscle. Depo-subQ Provera 104 injection is given under the skin. These injections are given by a health care professional. You must not be pregnant before getting an injection. The injection is usually given during the first 5 days after the start of a menstrual period or 6 weeks after delivery of a baby. Talk to your pediatrician regarding the use of this medicine in children. Special care may be needed. These injections  have been used in female children who have started having menstrual periods. Overdosage: If you think you have taken too much of this medicine contact a poison control center or emergency room at once. NOTE: This medicine is only for you. Do not share this medicine with others. What if I miss a dose? Try not to miss a dose. You must get an injection once every 3 months to maintain birth control. If you cannot keep an appointment, call and reschedule it. If you wait longer than 13 weeks between Depo-Provera contraceptive injections or longer than 14 weeks between Depo-subQ Provera 104 injections, you could get pregnant. Use another method for birth control if you miss your appointment. You may also need a pregnancy test before receiving another injection. What may interact with this medicine? Do not take this medicine with any of the following medications:  bosentan This medicine may also interact with the following medications:  aminoglutethimide  antibiotics or medicines for infections, especially rifampin, rifabutin, rifapentine, and griseofulvin  aprepitant  barbiturate medicines such as phenobarbital or primidone  bexarotene  carbamazepine  medicines for seizures like ethotoin, felbamate, oxcarbazepine, phenytoin, topiramate  modafinil  St. John's wort This list may not describe all possible interactions. Give your health care provider a list of all the medicines, herbs, non-prescription drugs, or dietary supplements you use. Also tell them if you smoke, drink alcohol, or use illegal drugs. Some items may interact with your medicine. What should I watch for while using this medicine? This drug does not protect you against HIV infection (AIDS) or other sexually transmitted diseases. Use of this product may cause you to lose calcium from your bones. Loss  of calcium may cause weak bones (osteoporosis). Only use this product for more than 2 years if other forms of birth control are not  right for you. The longer you use this product for birth control the more likely you will be at risk for weak bones. Ask your health care professional how you can keep strong bones. You may have a change in bleeding pattern or irregular periods. Many females stop having periods while taking this drug. If you have received your injections on time, your chance of being pregnant is very low. If you think you may be pregnant, see your health care professional as soon as possible. Tell your health care professional if you want to get pregnant within the next year. The effect of this medicine may last a long time after you get your last injection. What side effects may I notice from receiving this medicine? Side effects that you should report to your doctor or health care professional as soon as possible:  allergic reactions like skin rash, itching or hives, swelling of the face, lips, or tongue  breast tenderness or discharge  breathing problems  changes in vision  depression  feeling faint or lightheaded, falls  fever  pain in the abdomen, chest, groin, or leg  problems with balance, talking, walking  unusually weak or tired  yellowing of the eyes or skin Side effects that usually do not require medical attention (report to your doctor or health care professional if they continue or are bothersome):  acne  fluid retention and swelling  headache  irregular periods, spotting, or absent periods  temporary pain, itching, or skin reaction at site where injected  weight gain This list may not describe all possible side effects. Call your doctor for medical advice about side effects. You may report side effects to FDA at 1-800-FDA-1088. Where should I keep my medicine? This does not apply. The injection will be given to you by a health care professional. NOTE: This sheet is a summary. It may not cover all possible information. If you have questions about this medicine, talk to your  doctor, pharmacist, or health care provider.  2020 Elsevier/Gold Standard (2008-10-22 18:37:56)

## 2020-05-10 NOTE — Progress Notes (Signed)
Post Partum Visit Note  Lauren Delacruz is a 29 y.o. [phone] female who presents for a postpartum visit. She is 8 weeks postpartum following a normal spontaneous vaginal delivery.  I have fully reviewed the prenatal and intrapartum course. The delivery was at 36/5 gestational weeks.  Anesthesia: epidural. Postpartum course has been excellent. Baby is doing well at home with mom and 18 year old sister. Baby is feeding by both breast and bottle - Carnation Good Start. Bleeding no bleeding. She has not had a menstrual period since delivery. Bowel function is normal. Bladder function is normal. Patient is not sexually active. Contraception method is none. She wishes to have Depo Provera injection today. Postpartum depression screening: negative.  The pregnancy intention screening data noted above was reviewed. Potential methods of contraception were discussed. The patient elected to proceed with Hormonal Injection.   Review of Systems See HPI  Objective:  Last menstrual period 06/30/2019, unknown if currently breastfeeding.  General:  alert, cooperative and appears stated age   Breasts:  currently breast feeding- assessment deferred  Lungs: clear to auscultation bilaterally  Heart:  regular rate and rhythm, S1, S2 normal, no murmur, click, rub or gallop  Abdomen: soft, non-tender; bowel sounds normal; no masses,  no organomegaly   Vulva:  not evaluated  Vagina: not evaluated  Cervix:  not evaluated  Corpus: not examined  Adnexa:  not evaluated  Rectal Exam: Not performed.        Assessment:    Normal postpartum exam. Pap smear not done at today's visit. Due 10/2024  Plan:  1. Supervision of other high risk pregnancy, antepartum No concerns today. Patient received Depo Provera injection for contraception today. Discussed the importance of returning to receive injections on time to avoid lapse in contraceptive management.  Discussed returning to physical and sexual activity.   2. Encounter  for surveillance of injectable contraceptive Depo Provera chosen as contraception option. Patient has had this method of contraception in the past and was satisfied with the results. Discussed alternative contraception options in the event the patient wishes to change methods in the future. Pregnancy test negative today. All questions answered. Patient will be due for next injection by 08/03/2020. - medroxyPROGESTERone (DEPO-PROVERA) injection 150 mg  Essential components of care per ACOG recommendations:  1.  Mood and well being:  Patient with negative depression screening today.  Reviewed local resources for support.  - Patient does not use tobacco.  - hx of drug use? No    2. Infant care and feeding:  -Patient currently breastmilk feeding? Yes - She is also supplementing with formula.  - Reviewed importance of draining breast regularly to support lactation. - Discussed checking with [facility] program to determine if a breast pump is available for use. - Social determinants of health (SDOH) reviewed in EPIC. No concerns identified today.   3. Sexuality, contraception and birth spacing - Patient does not want a pregnancy in the next year.  - Desired family size is 2 children.  - Reviewed forms of contraception in tiered fashion. Patient desired abstinence, Depo-Provera today.   - Discussed birth spacing of at least 18 months  4. Sleep and fatigue -Encouraged family/partner/community support of 4 hrs of uninterrupted sleep to help with mood and fatigue.  5. Physical Recovery  - Discussed patients delivery and complications - Patient had no laceration - Patient has urinary incontinence? No  - Patient is safe to resume physical and sexual activity  6.  Health Maintenance - Last  pap smear done 10/2019 and was normal with negative HPV.   Henrietta Dine, CMA [facility] [employer], [facility]

## 2020-07-26 ENCOUNTER — Ambulatory Visit (INDEPENDENT_AMBULATORY_CARE_PROVIDER_SITE_OTHER): Payer: Medicaid Other

## 2020-07-26 ENCOUNTER — Other Ambulatory Visit: Payer: Self-pay

## 2020-07-26 VITALS — BP 141/84 | HR 88 | Wt 188.2 lb

## 2020-07-26 DIAGNOSIS — Z3042 Encounter for surveillance of injectable contraceptive: Secondary | ICD-10-CM | POA: Diagnosis not present

## 2020-07-26 MED ORDER — MEDROXYPROGESTERONE ACETATE 150 MG/ML IM SUSP
150.0000 mg | Freq: Once | INTRAMUSCULAR | Status: AC
  Administered 2020-07-26: 150 mg via INTRAMUSCULAR

## 2020-07-26 NOTE — Progress Notes (Signed)
Chart reviewed for nurse visit. Agree with plan of care.   Judeth Horn, NP 07/26/2020 4:42 PM

## 2020-07-26 NOTE — Progress Notes (Signed)
Lauren Delacruz here for Depo-Provera Injection. Injection administered without complication. Patient will return in 3 months for next injection. Next annual visit due 10/19/2020 .   Ralene Bathe, RN 07/26/2020  3:46 PM

## 2020-09-14 DIAGNOSIS — J189 Pneumonia, unspecified organism: Secondary | ICD-10-CM

## 2020-09-14 DIAGNOSIS — U071 COVID-19: Secondary | ICD-10-CM

## 2020-09-14 HISTORY — DX: Pneumonia, unspecified organism: J18.9

## 2020-09-14 HISTORY — DX: COVID-19: U07.1

## 2020-09-24 DIAGNOSIS — Z20822 Contact with and (suspected) exposure to covid-19: Secondary | ICD-10-CM | POA: Diagnosis not present

## 2020-09-28 ENCOUNTER — Ambulatory Visit (HOSPITAL_COMMUNITY)
Admission: EM | Admit: 2020-09-28 | Discharge: 2020-09-28 | Payer: Medicaid Other | Attending: Family Medicine | Admitting: Family Medicine

## 2020-09-28 ENCOUNTER — Encounter (HOSPITAL_COMMUNITY): Payer: Self-pay | Admitting: *Deleted

## 2020-09-28 ENCOUNTER — Inpatient Hospital Stay (HOSPITAL_COMMUNITY)
Admission: EM | Admit: 2020-09-28 | Discharge: 2020-10-03 | DRG: 177 | Disposition: A | Discharge status: Home / Self Care | Payer: Medicaid Other | Attending: Internal Medicine | Admitting: Internal Medicine

## 2020-09-28 ENCOUNTER — Other Ambulatory Visit: Payer: Self-pay

## 2020-09-28 ENCOUNTER — Emergency Department (HOSPITAL_COMMUNITY): Payer: Medicaid Other

## 2020-09-28 DIAGNOSIS — E669 Obesity, unspecified: Secondary | ICD-10-CM | POA: Diagnosis present

## 2020-09-28 DIAGNOSIS — R000 Tachycardia, unspecified: Secondary | ICD-10-CM | POA: Diagnosis not present

## 2020-09-28 DIAGNOSIS — Z803 Family history of malignant neoplasm of breast: Secondary | ICD-10-CM

## 2020-09-28 DIAGNOSIS — U071 COVID-19: Principal | ICD-10-CM | POA: Diagnosis present

## 2020-09-28 DIAGNOSIS — Z6831 Body mass index (BMI) 31.0-31.9, adult: Secondary | ICD-10-CM

## 2020-09-28 DIAGNOSIS — D563 Thalassemia minor: Secondary | ICD-10-CM | POA: Diagnosis present

## 2020-09-28 DIAGNOSIS — J1282 Pneumonia due to coronavirus disease 2019: Secondary | ICD-10-CM | POA: Diagnosis not present

## 2020-09-28 DIAGNOSIS — Z8632 Personal history of gestational diabetes: Secondary | ICD-10-CM

## 2020-09-28 DIAGNOSIS — Z8042 Family history of malignant neoplasm of prostate: Secondary | ICD-10-CM

## 2020-09-28 DIAGNOSIS — Z880 Allergy status to penicillin: Secondary | ICD-10-CM

## 2020-09-28 DIAGNOSIS — J9601 Acute respiratory failure with hypoxia: Secondary | ICD-10-CM

## 2020-09-28 DIAGNOSIS — Z87891 Personal history of nicotine dependence: Secondary | ICD-10-CM

## 2020-09-28 DIAGNOSIS — R7401 Elevation of levels of liver transaminase levels: Secondary | ICD-10-CM | POA: Diagnosis present

## 2020-09-28 DIAGNOSIS — J90 Pleural effusion, not elsewhere classified: Secondary | ICD-10-CM | POA: Diagnosis not present

## 2020-09-28 DIAGNOSIS — Z8249 Family history of ischemic heart disease and other diseases of the circulatory system: Secondary | ICD-10-CM

## 2020-09-28 DIAGNOSIS — J189 Pneumonia, unspecified organism: Secondary | ICD-10-CM | POA: Diagnosis not present

## 2020-09-28 DIAGNOSIS — R059 Cough, unspecified: Secondary | ICD-10-CM | POA: Diagnosis not present

## 2020-09-28 DIAGNOSIS — E871 Hypo-osmolality and hyponatremia: Secondary | ICD-10-CM

## 2020-09-28 DIAGNOSIS — Z79899 Other long term (current) drug therapy: Secondary | ICD-10-CM

## 2020-09-28 LAB — COMPREHENSIVE METABOLIC PANEL
ALT: 92 U/L — ABNORMAL HIGH (ref 0–44)
AST: 157 U/L — ABNORMAL HIGH (ref 15–41)
Albumin: 2.9 g/dL — ABNORMAL LOW (ref 3.5–5.0)
Alkaline Phosphatase: 51 U/L (ref 38–126)
Anion gap: 12 (ref 5–15)
BUN: 5 mg/dL — ABNORMAL LOW (ref 6–20)
CO2: 27 mmol/L (ref 22–32)
Calcium: 8.3 mg/dL — ABNORMAL LOW (ref 8.9–10.3)
Chloride: 91 mmol/L — ABNORMAL LOW (ref 98–111)
Creatinine, Ser: 0.79 mg/dL (ref 0.44–1.00)
GFR, Estimated: >60 mL/min (ref >60)
Glucose, Bld: 102 mg/dL — ABNORMAL HIGH (ref 70–99)
Potassium: 3.7 mmol/L (ref 3.5–5.1)
Sodium: 130 mmol/L — ABNORMAL LOW (ref 135–145)
Total Bilirubin: 0.6 mg/dL (ref 0.3–1.2)
Total Protein: 7.3 g/dL (ref 6.5–8.1)

## 2020-09-28 LAB — CBC WITH DIFFERENTIAL/PLATELET
Abs Immature Granulocytes: 0.05 10*3/uL (ref 0.00–0.07)
Basophils Absolute: 0 10*3/uL (ref 0.0–0.1)
Basophils Relative: 0 %
Eosinophils Absolute: 0 10*3/uL (ref 0.0–0.5)
Eosinophils Relative: 0 %
HCT: 39.1 % (ref 36.0–46.0)
Hemoglobin: 12.5 g/dL (ref 12.0–15.0)
Immature Granulocytes: 1 %
Lymphocytes Relative: 15 %
Lymphs Abs: 1.2 10*3/uL (ref 0.7–4.0)
MCH: 26.4 pg (ref 26.0–34.0)
MCHC: 32 g/dL (ref 30.0–36.0)
MCV: 82.7 fL (ref 80.0–100.0)
Monocytes Absolute: 0.1 10*3/uL (ref 0.1–1.0)
Monocytes Relative: 1 %
Neutro Abs: 6.3 10*3/uL (ref 1.7–7.7)
Neutrophils Relative %: 83 %
Platelets: 311 10*3/uL (ref 150–400)
RBC: 4.73 MIL/uL (ref 3.87–5.11)
RDW: 13.2 % (ref 11.5–15.5)
WBC: 7.6 10*3/uL (ref 4.0–10.5)
nRBC: 0 % (ref 0.0–0.2)

## 2020-09-28 LAB — CBC
HCT: 38.4 % (ref 36.0–46.0)
Hemoglobin: 13 g/dL (ref 12.0–15.0)
MCH: 27.3 pg (ref 26.0–34.0)
MCHC: 33.9 g/dL (ref 30.0–36.0)
MCV: 80.7 fL (ref 80.0–100.0)
Platelets: 322 10*3/uL (ref 150–400)
RBC: 4.76 MIL/uL (ref 3.87–5.11)
RDW: 13.1 % (ref 11.5–15.5)
WBC: 7.7 10*3/uL (ref 4.0–10.5)
nRBC: 0 % (ref 0.0–0.2)

## 2020-09-28 LAB — D-DIMER, QUANTITATIVE: D-Dimer, Quant: 2.39 ug/mL-FEU — ABNORMAL HIGH (ref 0.00–0.50)

## 2020-09-28 LAB — I-STAT BETA HCG BLOOD, ED (MC, WL, AP ONLY)
I-stat hCG, quantitative: <5 m[IU]/mL (ref <5)
I-stat hCG, quantitative: <5 m[IU]/mL (ref <5)

## 2020-09-28 LAB — BASIC METABOLIC PANEL
Anion gap: 11 (ref 5–15)
BUN: 5 mg/dL — ABNORMAL LOW (ref 6–20)
CO2: 27 mmol/L (ref 22–32)
Calcium: 8.6 mg/dL — ABNORMAL LOW (ref 8.9–10.3)
Chloride: 93 mmol/L — ABNORMAL LOW (ref 98–111)
Creatinine, Ser: 0.87 mg/dL (ref 0.44–1.00)
GFR, Estimated: >60 mL/min (ref >60)
Glucose, Bld: 124 mg/dL — ABNORMAL HIGH (ref 70–99)
Potassium: 3.7 mmol/L (ref 3.5–5.1)
Sodium: 131 mmol/L — ABNORMAL LOW (ref 135–145)

## 2020-09-28 LAB — RESP PANEL BY RT-PCR (FLU A&B, COVID) ARPGX2
Influenza A by PCR: NEGATIVE
Influenza B by PCR: NEGATIVE
SARS Coronavirus 2 by RT PCR: POSITIVE — AB

## 2020-09-28 LAB — FIBRINOGEN: Fibrinogen: 668 mg/dL — ABNORMAL HIGH (ref 210–475)

## 2020-09-28 LAB — PROCALCITONIN: Procalcitonin: 0.14 ng/mL

## 2020-09-28 LAB — LACTATE DEHYDROGENASE: LDH: 502 U/L — ABNORMAL HIGH (ref 98–192)

## 2020-09-28 LAB — C-REACTIVE PROTEIN: CRP: 10.3 mg/dL — ABNORMAL HIGH (ref <1.0)

## 2020-09-28 LAB — TRIGLYCERIDES: Triglycerides: 80 mg/dL (ref <150)

## 2020-09-28 LAB — LACTIC ACID, PLASMA: Lactic Acid, Venous: 1.3 mmol/L (ref 0.5–1.9)

## 2020-09-28 LAB — FERRITIN: Ferritin: 262 ng/mL (ref 11–307)

## 2020-09-28 MED ORDER — IOHEXOL 350 MG/ML SOLN
80.0000 mL | Freq: Once | INTRAVENOUS | Status: AC | PRN
  Administered 2020-09-28: 80 mL via INTRAVENOUS

## 2020-09-28 MED ORDER — ALBUTEROL SULFATE HFA 108 (90 BASE) MCG/ACT IN AERS
1.0000 | INHALATION_SPRAY | Freq: Four times a day (QID) | RESPIRATORY_TRACT | Status: DC
  Administered 2020-09-28: 23:00:00 2 via RESPIRATORY_TRACT
  Filled 2020-09-28: qty 6.7

## 2020-09-28 MED ORDER — SODIUM CHLORIDE 0.9 % IV SOLN
1000.0000 mL | INTRAVENOUS | Status: DC
  Administered 2020-09-28: 23:00:00 1000 mL via INTRAVENOUS

## 2020-09-28 MED ORDER — GUAIFENESIN-DM 100-10 MG/5ML PO SYRP
10.0000 mL | ORAL_SOLUTION | ORAL | Status: DC | PRN
  Administered 2020-09-29 – 2020-10-02 (×11): 10 mL via ORAL
  Filled 2020-09-28 (×11): qty 10

## 2020-09-28 MED ORDER — ENOXAPARIN SODIUM 40 MG/0.4ML ~~LOC~~ SOLN
40.0000 mg | SUBCUTANEOUS | Status: DC
  Administered 2020-09-28 – 2020-10-02 (×5): 40 mg via SUBCUTANEOUS
  Filled 2020-09-28 (×5): qty 0.4

## 2020-09-28 MED ORDER — ACETAMINOPHEN 325 MG PO TABS: 650.0000 mg | ORAL_TABLET | Freq: Four times a day (QID) | ORAL | Status: DC | PRN

## 2020-09-28 MED ORDER — ACETAMINOPHEN 325 MG PO TABS
975.0000 mg | ORAL_TABLET | Freq: Once | ORAL | Status: AC
  Administered 2020-09-28: 975 mg via ORAL

## 2020-09-28 MED ORDER — SODIUM CHLORIDE 0.9 % IV SOLN
100.0000 mg | Freq: Every day | INTRAVENOUS | Status: AC
  Administered 2020-09-29 – 2020-10-02 (×4): 100 mg via INTRAVENOUS
  Filled 2020-09-28 (×5): qty 20

## 2020-09-28 MED ORDER — ACETAMINOPHEN 325 MG PO TABS
650.0000 mg | ORAL_TABLET | Freq: Four times a day (QID) | ORAL | Status: DC | PRN
  Administered 2020-09-29: 650 mg via ORAL
  Filled 2020-09-28: qty 2

## 2020-09-28 MED ORDER — ACETAMINOPHEN 325 MG PO TABS
ORAL_TABLET | ORAL | Status: AC
  Filled 2020-09-28: qty 3

## 2020-09-28 MED ORDER — MORPHINE SULFATE (PF) 4 MG/ML IV SOLN
4.0000 mg | Freq: Once | INTRAVENOUS | Status: AC
  Administered 2020-09-28: 4 mg via INTRAVENOUS
  Filled 2020-09-28: qty 1

## 2020-09-28 MED ORDER — DEXAMETHASONE SODIUM PHOSPHATE 10 MG/ML IJ SOLN
10.0000 mg | Freq: Once | INTRAMUSCULAR | Status: AC
  Administered 2020-09-28: 22:00:00 10 mg via INTRAVENOUS
  Filled 2020-09-28: qty 1

## 2020-09-28 MED ORDER — SODIUM CHLORIDE 0.9% FLUSH
3.0000 mL | Freq: Two times a day (BID) | INTRAVENOUS | Status: DC
  Administered 2020-09-28 – 2020-10-03 (×10): 3 mL via INTRAVENOUS

## 2020-09-28 MED ORDER — POLYETHYLENE GLYCOL 3350 17 G PO PACK: 17.0000 g | PACK | Freq: Every day | ORAL | Status: DC | PRN

## 2020-09-28 MED ORDER — SODIUM CHLORIDE 0.9 % IV SOLN
200.0000 mg | Freq: Once | INTRAVENOUS | Status: AC
  Administered 2020-09-28: 23:00:00 200 mg via INTRAVENOUS
  Filled 2020-09-28: qty 40

## 2020-09-28 MED ORDER — ONDANSETRON HCL 4 MG/2ML IJ SOLN
4.0000 mg | Freq: Once | INTRAMUSCULAR | Status: DC
  Filled 2020-09-28: qty 2

## 2020-09-28 MED ORDER — HYDROCOD POLST-CPM POLST ER 10-8 MG/5ML PO SUER
5.0000 mL | Freq: Two times a day (BID) | ORAL | Status: DC | PRN
  Administered 2020-09-28 – 2020-10-02 (×7): 5 mL via ORAL
  Filled 2020-09-28 (×7): qty 5

## 2020-09-28 MED ORDER — DEXAMETHASONE SODIUM PHOSPHATE 10 MG/ML IJ SOLN: 6.0000 mg | INTRAMUSCULAR | Status: DC

## 2020-09-28 NOTE — ED Triage Notes (Signed)
The pt has had covid symptoms since dec 5th she was tested at [facility] on dec 12 and the results were  Positive. She was sent [facility] from [facility] today  The pt is c/o a productive cough and and chest congestion with difficulty breathing  An aunt came to town with covid and exposed everyone in the family  lmp on depo  Today would have been her last day of quarante   She was given tylenol at [facility] before they sent her down  She has not had the vaccines

## 2020-09-28 NOTE — ED Notes (Signed)
Pt is positive for COVID and is on last day of self isolation.

## 2020-09-28 NOTE — ED Notes (Signed)
Pt returned from CT °

## 2020-09-28 NOTE — ED Notes (Signed)
Patient checked in with SOB  States she is Covid positive.  o2 sat is 88% on RA.  Magda Paganini RN notified and brought patient into triage room.

## 2020-09-28 NOTE — ED Notes (Signed)
PT check in at desk 88% on room air . Pt started on nasal O2 with O2 sats 90% , Nalsal O2  95% on 4 liters

## 2020-09-28 NOTE — H&P (Signed)
History and Physical   Lauren Delacruz GMW:[id number] DOB: 1991-02-16 DOA: 09/28/2020  PCP: [facility] [place]    Patient coming from: Home  Chief Complaint: Shortness of breath  HPI: Lauren Delacruz is a 29 y.o. female with medical history significant of alpha thalassemia trait, gestational diabetes who presents with worsening shortness of breath in the setting of known Covid infection.  Patient has had symptoms of Covid since December 5 which consisted of body aches, fever, cough, shortness of breath, congestion. She tested positive on December 12 and has been quarantining at home.  She was exposed to Covid after an aunt visited early in the month who was Covid positive.  She is unvaccinated.  Her symptoms have been improving at home until yesterday when she began to have significant chest congestion and worsening shortness of breath which continued into today.  She presented to urgent care who transferred her to the ED given her story and hypoxia on room air.  As above she does report significant worsening shortness of breath and chest congestion.  She also has reported some pleuritic chest pain.  She denies abdominal pain or GI symptoms.  ED Course: Vitals in the ED significant for fever to 100.6, tachycardia in the 1 teens to 120s, intermittent tachypnea into the 20s, saturations of 88% on room air improving to mid 90s to upper 90s on 4 L.  Lab work showed mild hyponatremia at 131, glucose 124.  CBC within normal limits.  Respiratory panel to confirm Covid pending.  Beta hCG pending.  Inflammatory markers of D-dimer, procalcitonin, LDH, ferritin, fibrinogen, triglycerides, CRP are pending.  Blood cultures and lactic acid are pending.  Chest x-ray in the ED showed patchy bilateral airspace disease with left pleural effusion.  CT angio showed no acute PE and commented on the extensive bilateral groundglass densities right greater than left.  Patient was started on Decadron and and rib does appear in the ED  as well supplemental oxygen.  Review of Systems: As per HPI otherwise all other systems reviewed and are negative.  Past Medical History:  Diagnosis Date  . Gestational diabetes   . History of gestational diabetes mellitus (GDM)   . History of ovarian cyst     History reviewed. No pertinent surgical history.  Social History  reports that she has quit smoking. She has never used smokeless tobacco. She reports previous alcohol use. She reports that she does not use drugs.  Allergies  Allergen Reactions  . Penicillins Other (See Comments)    Mother told her she was allergic, has taken Amoxicillin w/o problem    Family History  Problem Relation Age of Onset  . Hypertension Mother   . Hypertension Father   . Prostate cancer Father   . Breast cancer Paternal Aunt   . Hypertension Maternal Grandmother   Reviewed on admission  Prior to Admission medications   Medication Sig Start Date End Date Taking? Authorizing Provider  Blood Pressure Monitoring (BLOOD PRESSURE KIT) DEVI 1 Device by Does not apply route as needed. Patient not taking: Reported on 07/26/2020 10/15/19   Emily Filbert, MD  ferrous sulfate 325 (65 FE) MG tablet Take 325 mg by mouth daily with breakfast.    [provider]  ibuprofen (ADVIL) 600 MG tablet Take 1 tablet (600 mg total) by mouth every 6 (six) hours. 03/14/20   Seabron Spates, CNM  Prenatal Vit-Fe Fumarate-FA (MULTIVITAMIN-PRENATAL) 27-0.8 MG TABS tablet Take 1 tablet by mouth daily at 12 noon.  [provider]    Physical Exam: Vitals:   09/28/20 2015 09/28/20 2018 09/28/20 2019 09/28/20 2045  BP: 123/81   119/80  Pulse: (!) 128   (!) 128  Resp: (!) 22   (!) 25  Temp:      TempSrc:      SpO2: 96% (!) 88% 95% 97%  Weight:      Height:       Physical Exam Constitutional:      General: She is not in acute distress.    Appearance: Normal appearance.  HENT:     Head: Normocephalic and atraumatic.     Mouth/Throat:      Mouth: Mucous membranes are moist.     Pharynx: Oropharynx is clear.  Eyes:     Extraocular Movements: Extraocular movements intact.     Pupils: Pupils are equal, round, and reactive to light.  Cardiovascular:     Rate and Rhythm: Regular rhythm. Tachycardia present.     Pulses: Normal pulses.     Heart sounds: Normal heart sounds.  Pulmonary:     Breath sounds: Rhonchi and rales present.     Comments: Mildly increased work of breathing Abdominal:     General: Bowel sounds are normal. There is no distension.     Palpations: Abdomen is soft.     Tenderness: There is no abdominal tenderness.  Musculoskeletal:        General: No swelling or deformity.  Skin:    General: Skin is warm and dry.  Neurological:     General: No focal deficit present.     Mental Status: Mental status is at baseline.    Labs on Admission: I have personally reviewed following labs and imaging studies  CBC: Recent Labs  Lab 09/28/20 1519  WBC 7.7  HGB 13.0  HCT 38.4  MCV 80.7  PLT 798    Basic Metabolic Panel: Recent Labs  Lab 09/28/20 1519  NA 131*  K 3.7  CL 93*  CO2 27  GLUCOSE 124*  BUN 5*  CREATININE 0.87  CALCIUM 8.6*    GFR: Estimated Creatinine Clearance: 98.2 mL/min (by C-G formula based on SCr of 0.87 mg/dL).  Liver Function Tests: No results for input(s): AST, ALT, ALKPHOS, BILITOT, PROT, ALBUMIN in the last 168 hours.  Urine analysis: No results found for: COLORURINE, APPEARANCEUR, LABSPEC, PHURINE, GLUCOSEU, HGBUR, BILIRUBINUR, KETONESUR, PROTEINUR, UROBILINOGEN, NITRITE, LEUKOCYTESUR  Radiological Exams on Admission: CT Angio Chest PE W and/or Wo Contrast  Result Date: 09/28/2020 CLINICAL DATA:  COVID cough difficulty breathing EXAM: CT ANGIOGRAPHY CHEST WITH CONTRAST TECHNIQUE: Multidetector CT imaging of the chest was performed using the standard protocol during bolus administration of intravenous contrast. Multiplanar CT image reconstructions and MIPs were obtained  to evaluate the vascular anatomy. CONTRAST:  60 mL OMNIPAQUE IOHEXOL 350 MG/ML SOLN COMPARISON:  Chest x-ray 09/28/2020 FINDINGS: Cardiovascular: Satisfactory opacification of the pulmonary arteries to the segmental level. No evidence of pulmonary embolism. Nonaneurysmal aorta. Normal cardiac size. No pericardial effusion. Mediastinum/Nodes: No enlarged mediastinal, hilar, or axillary lymph nodes. Thyroid gland, trachea, and esophagus demonstrate no significant findings. Lungs/Pleura: Fairly extensive bilateral ground-glass densities and consolidations within the right greater than left lungs. No pleural effusion or pneumothorax. Upper Abdomen: No acute abnormality. Musculoskeletal: No chest wall abnormality. No acute or significant osseous findings. Review of the MIP images confirms the above findings. IMPRESSION: 1. Negative for acute pulmonary embolus. 2. Fairly extensive bilateral ground-glass densities and consolidations within the right greater than left lungs, consistent  with bilateral pneumonia and history of COVID positivity. Electronically Signed   By: Donavan Foil M.D.   On: 09/28/2020 21:30   DG Chest Portable 1 View  Result Date: 09/28/2020 CLINICAL DATA:  COVID positive. Chest congestion. Productive cough. EXAM: PORTABLE CHEST 1 VIEW COMPARISON:  None. FINDINGS: Heart size is normal. Lung volumes are low. Patchy bilateral airspace opacities are present in the right middle lobe and left lower lobe predominantly. Left effusion is present. Axial skeleton is unremarkable. IMPRESSION: 1. Patchy bilateral airspace disease compatible with multifocal pneumonia. 2. Left pleural effusion. 3. Low lung volumes. Electronically Signed   By: San Morelle M.D.   On: 09/28/2020 15:38    EKG: Independently reviewed. Sinus Tachycardia @ 119 BPM. Wandering Baseline.  Assessment/Plan Principal Problem:   Pneumonia due to COVID-19 virus Active Problems:   Acute respiratory failure with hypoxia (HCC)    Hyponatremia  Acute respiratory failure with hypoxia > Known Covid positive, symptoms started December 5, initially improving but worse for the last 2 days > Negative for PE on CTA, but bilateral groundglass opacities confirmed > Requiring 4 L to maintain saturations in ED, initially hypoxic to 88% on room air > Also febrile and tachycardic in the ED - Supplemental oxygen as needed, currently on 4 L - Continue remdesivir and Decadron - Albuterol every 6 hours while awake - Pulmonary hygiene - As needed Tylenol - Follow-up pending labs (inflammatory markers, cultures, lactic acid) - Trend CRP, CBC, CMP, D-dimer, ferritin, magnesium, phosphorus  Hyponatremia > Mild, 131, unclear duration - Continue to monitor for now  DVT prophylaxis: Lovenox  Code Status:   Full  Family Communication:  None on admission  Disposition Plan:   Patient is from:  Home  Anticipated DC to:  Home  Anticipated DC date:  Pending clinical course  Anticipated DC barriers: None  Consults called:  None  Admission status:  Observation, telemetry   Severity of Illness: The appropriate patient status for this patient is OBSERVATION. Observation status is judged to be reasonable and necessary in order to provide the required intensity of service to ensure the patient's safety. The patient's presenting symptoms, physical exam findings, and initial radiographic and laboratory data in the context of their medical condition is felt to place them at decreased risk for further clinical deterioration. Furthermore, it is anticipated that the patient will be medically stable for discharge from the hospital within 2 midnights of admission. The following factors support the patient status of observation.   " The patient's presenting symptoms include shortness of breath, chest pain. " The physical exam findings include tachycardia, rhonchi, rales. " The initial radiographic and laboratory data are chest x-ray and CTA consistent  with COVID-19 pneumonia.  Mild hyponatremia.   Marcelyn Bruins MD [facility]  How to contact the [facility] Attending or Consulting provider [place] or covering provider during after hours [place], for this patient?   1. Check the care team in [facility] and look for a) attending/consulting [facility] provider listed and b) the [facility] team listed 2. Log into [email or web address] and use [place]'s universal password to access. If you do not have the password, please contact the hospital operator. 3. Locate the [facility] provider you are looking for under [facility] and page to a number that you can be directly reached. 4. If you still have difficulty reaching the provider, please page the [facility] (Director on Call) for the Hospitalists listed on amion for assistance.  09/28/2020, 10:05 PM

## 2020-09-28 NOTE — ED Notes (Signed)
DR in Room with Pt to assess needs.

## 2020-09-28 NOTE — ED Notes (Signed)
This RN attempted several times to draw blood specimens and insert another PIV with no success. Phlebotomy will be consulted.

## 2020-09-28 NOTE — ED Notes (Signed)
Pt transported to CT at this time.

## 2020-09-28 NOTE — ED Provider Notes (Signed)
[facility] EMERGENCY DEPARTMENT Provider Note   CSN: [id number] Arrival date & time: 09/28/20  1430     History Chief Complaint  Patient presents with  . Covid Positive  . Respiratory Distress    Lauren Delacruz is a 29 y.o. female.  The history is provided by the patient. No language interpreter was used.     29-year-old female significant history of gestational diabetes, recently test positive COVID-19 presenting with complaints of shortness of breath.  Patient developed covid symptom approximately 10 days ago, had tested positive at [facility] three days ago.  She went to urgent care today due to having difficulty breathing .  Patient states 10 days ago she developed Covid symptoms along with the rest of her family who are all tested positive for Covid infection.  Initial symptoms include fever chills body aches runny nose sneezing coughing sore throat loss of taste and smell nausea vomiting diarrhea.  She has been in quarantine and symptoms mostly resolved however today she developed acute onset of shortness of breath and pain in her chest with pleuritic component prompting this ER visit.  She has not been vaccinated for COVID-19.  She denies any prior history of PE or DVT.  Past Medical History:  Diagnosis Date  . Gestational diabetes   . History of gestational diabetes mellitus (GDM)   . History of ovarian cyst     Patient Active Problem List   Diagnosis Date Noted  . Preterm premature rupture of membranes (PPROM) with onset of labor within 24 hours of rupture in third trimester, antepartum 03/13/2020  . SVD (spontaneous vaginal delivery) 03/13/2020  . Alpha thalassemia silent carrier 11/10/2019  . Anemia in pregnancy 10/20/2019  . History of gestational diabetes mellitus (GDM)     History reviewed. No pertinent surgical history.   OB History    Gravida  3   Para  2   Term  1   Preterm  1   AB  1   Living  2     SAB  0   IAB  1   Ectopic  0    Multiple  0   Live Births  2           Family History  Problem Relation Age of Onset  . Hypertension Mother   . Hypertension Father   . Prostate cancer Father   . Breast cancer Paternal Aunt   . Hypertension Maternal Grandmother     Social History   Tobacco Use  . Smoking status: Former Smoker  . Smokeless tobacco: Never Used  . Tobacco comment: socially  Vaping Use  . Vaping Use: Never used  Substance Use Topics  . Alcohol use: Not Currently    Comment: socially  . Drug use: Never    Home Medications Prior to Admission medications   Medication Sig Start Date End Date Taking? Authorizing Provider  Blood Pressure Monitoring (BLOOD PRESSURE KIT) DEVI 1 Device by Does not apply route as needed. Patient not taking: Reported on 07/26/2020 10/15/19   Dove, Myra C, MD  ferrous sulfate 325 (65 FE) MG tablet Take 325 mg by mouth daily with breakfast.    [provider]  ibuprofen (ADVIL) 600 MG tablet Take 1 tablet (600 mg total) by mouth every 6 (six) hours. 03/14/20   Williams, Marie L, CNM  Prenatal Vit-Fe Fumarate-FA (MULTIVITAMIN-PRENATAL) 27-0.8 MG TABS tablet Take 1 tablet by mouth daily at 12 noon.    [provider]      Allergies    Penicillins  Review of Systems   Review of Systems  All other systems reviewed and are negative.   Physical Exam Updated Vital Signs BP 103/76 (BP Location: Left Arm)   Pulse (!) 115   Temp 99.9 F (37.7 C) (Oral)   Resp (!) 22   Ht 5' 4" (1.626 m)   Wt 83.9 kg   SpO2 95%   BMI 31.76 kg/m   Physical Exam Vitals and nursing note reviewed.  Constitutional:      Appearance: She is well-developed and well-nourished.     Comments: Patient appears uncomfortable  HENT:     Head: Atraumatic.  Eyes:     Conjunctiva/sclera: Conjunctivae normal.  Cardiovascular:     Rate and Rhythm: Tachycardia present.     Pulses: Normal pulses.     Heart sounds: Normal heart sounds.  Pulmonary:     Breath sounds: Rales  present. No wheezing or rhonchi.  Abdominal:     Palpations: Abdomen is soft.     Tenderness: There is no abdominal tenderness.  Musculoskeletal:     Cervical back: Neck supple.  Skin:    General: Skin is warm.     Findings: No rash.  Neurological:     Mental Status: She is alert and oriented to person, place, and time.  Psychiatric:        Mood and Affect: Mood and affect and mood normal.     ED Results / Procedures / Treatments   Labs (all labs ordered are listed, but only abnormal results are displayed) Labs Reviewed  BASIC METABOLIC PANEL - Abnormal; Notable for the following components:      Result Value   Sodium 131 (*)    Chloride 93 (*)    Glucose, Bld 124 (*)    BUN 5 (*)    Calcium 8.6 (*)    All other components within normal limits  RESP PANEL BY RT-PCR (FLU A&B, COVID) ARPGX2  CULTURE, BLOOD (ROUTINE X 2)  CULTURE, BLOOD (ROUTINE X 2)  CBC  LACTIC ACID, PLASMA  LACTIC ACID, PLASMA  CBC WITH DIFFERENTIAL/PLATELET  COMPREHENSIVE METABOLIC PANEL  D-DIMER, QUANTITATIVE (NOT AT [facility])  PROCALCITONIN  LACTATE DEHYDROGENASE  FERRITIN  TRIGLYCERIDES  FIBRINOGEN  C-REACTIVE PROTEIN  I-STAT BETA HCG BLOOD, ED ([facility], [facility], [facility] ONLY)  I-STAT BETA HCG BLOOD, ED ([facility], [facility], [facility] ONLY)    EKG None  Radiology DG Chest Portable 1 View  Result Date: 09/28/2020 CLINICAL DATA:  COVID positive. Chest congestion. Productive cough. EXAM: PORTABLE CHEST 1 VIEW COMPARISON:  None. FINDINGS: Heart size is normal. Lung volumes are low. Patchy bilateral airspace opacities are present in the right middle lobe and left lower lobe predominantly. Left effusion is present. Axial skeleton is unremarkable. IMPRESSION: 1. Patchy bilateral airspace disease compatible with multifocal pneumonia. 2. Left pleural effusion. 3. Low lung volumes. Electronically Signed   By: Christopher  Mattern M.D.   On: 09/28/2020 15:38    Procedures .Critical Care Performed by: Tran, Bowie, PA-C Authorized by:  Tran, Bowie, PA-C   Critical care provider statement:    Critical care time (minutes):  40   Critical care was time spent personally by me on the following activities:  Discussions with consultants, evaluation of patient's response to treatment, examination of patient, ordering and performing treatments and interventions, ordering and review of laboratory studies, ordering and review of radiographic studies, pulse oximetry, re-evaluation of patient's condition, obtaining history from patient or surrogate and review of old charts   (  including critical care time)  Medications Ordered in ED Medications  ondansetron (ZOFRAN) injection 4 mg (has no administration in time range)  0.9 %  sodium chloride infusion (has no administration in time range)  dexamethasone (DECADRON) injection 10 mg (has no administration in time range)  remdesivir 200 mg in sodium chloride 0.9% 250 mL IVPB (has no administration in time range)    Followed by  remdesivir 100 mg in sodium chloride 0.9 % 100 mL IVPB (has no administration in time range)  morphine 4 MG/ML injection 4 mg (4 mg Intravenous Given 09/28/20 1958)  iohexol (OMNIPAQUE) 350 MG/ML injection 80 mL (80 mLs Intravenous Contrast Given 09/28/20 2116)    ED Course  I have reviewed the triage vital signs and the nursing notes.  Pertinent labs & imaging results that were available during my care of the patient were reviewed by me and considered in my medical decision making (see chart for details).    MDM Rules/Calculators/A&P                          BP 119/80   Pulse (!) 128   Temp (!) 100.6 F (38.1 C) (Oral)   Resp (!) 25   Ht 5' 4" (1.626 m)   Wt 83.9 kg   SpO2 97%   BMI 31.76 kg/m   Final Clinical Impression(s) / ED Diagnoses Final diagnoses:  Acute hypoxemic respiratory failure due to COVID-19 (HCC)    Rx / DC Orders ED Discharge Orders    None     7:36 PM Patient tested positive for COVID-19, has had symptoms for 10 days.   Most of symptoms mostly resolved however now she is developing acute onset of pleuritic chest pain and shortness of breath concerning for PE.  Chest x-ray shows multifocal pneumonia as well as pleural effusion.  Will obtain chest CTA.  8:12 PM Patient sats at 88% while at rest on room air.  Patient was placed on supplemental oxygen, appropriate labs ordered, anticipate admission.  9:29 PM Appreciate consultation to [facility] who agrees to see and admit pt for further care.  I have started decadron and Remdesevir on this pt.  Chest CTA currently pending.  There was a delay in pt's labs as she is away for CT scan.   Geni Macgregor was evaluated in Emergency Department on 09/28/2020 for the symptoms described in the history of present illness. She was evaluated in the context of the global COVID-19 pandemic, which necessitated consideration that the patient might be at risk for infection with the SARS-CoV-2 virus that causes COVID-19. Institutional protocols and algorithms that pertain to the evaluation of patients at risk for COVID-19 are in a state of rapid change based on information released by regulatory bodies including the CDC and federal and state organizations. These policies and algorithms were followed during the patient's care in the ED.    Tran, Bowie, PA-C 09/28/20 2131    Rees, Elizabeth, MD 09/29/20 0043  

## 2020-09-29 DIAGNOSIS — D563 Thalassemia minor: Secondary | ICD-10-CM | POA: Diagnosis not present

## 2020-09-29 DIAGNOSIS — J90 Pleural effusion, not elsewhere classified: Secondary | ICD-10-CM | POA: Diagnosis not present

## 2020-09-29 DIAGNOSIS — E871 Hypo-osmolality and hyponatremia: Secondary | ICD-10-CM

## 2020-09-29 DIAGNOSIS — J1282 Pneumonia due to coronavirus disease 2019: Secondary | ICD-10-CM | POA: Diagnosis not present

## 2020-09-29 DIAGNOSIS — Z87891 Personal history of nicotine dependence: Secondary | ICD-10-CM | POA: Diagnosis not present

## 2020-09-29 DIAGNOSIS — J9601 Acute respiratory failure with hypoxia: Secondary | ICD-10-CM | POA: Diagnosis not present

## 2020-09-29 DIAGNOSIS — R000 Tachycardia, unspecified: Secondary | ICD-10-CM | POA: Diagnosis not present

## 2020-09-29 DIAGNOSIS — Z6831 Body mass index (BMI) 31.0-31.9, adult: Secondary | ICD-10-CM | POA: Diagnosis not present

## 2020-09-29 DIAGNOSIS — R059 Cough, unspecified: Secondary | ICD-10-CM | POA: Diagnosis not present

## 2020-09-29 DIAGNOSIS — U071 COVID-19: Secondary | ICD-10-CM | POA: Diagnosis not present

## 2020-09-29 DIAGNOSIS — E669 Obesity, unspecified: Secondary | ICD-10-CM | POA: Diagnosis not present

## 2020-09-29 DIAGNOSIS — Z880 Allergy status to penicillin: Secondary | ICD-10-CM | POA: Diagnosis not present

## 2020-09-29 DIAGNOSIS — Z8042 Family history of malignant neoplasm of prostate: Secondary | ICD-10-CM | POA: Diagnosis not present

## 2020-09-29 DIAGNOSIS — Z8249 Family history of ischemic heart disease and other diseases of the circulatory system: Secondary | ICD-10-CM | POA: Diagnosis not present

## 2020-09-29 DIAGNOSIS — J189 Pneumonia, unspecified organism: Secondary | ICD-10-CM | POA: Diagnosis not present

## 2020-09-29 DIAGNOSIS — Z8632 Personal history of gestational diabetes: Secondary | ICD-10-CM | POA: Diagnosis not present

## 2020-09-29 DIAGNOSIS — Z79899 Other long term (current) drug therapy: Secondary | ICD-10-CM | POA: Diagnosis not present

## 2020-09-29 DIAGNOSIS — Z803 Family history of malignant neoplasm of breast: Secondary | ICD-10-CM | POA: Diagnosis not present

## 2020-09-29 DIAGNOSIS — R7401 Elevation of levels of liver transaminase levels: Secondary | ICD-10-CM | POA: Diagnosis not present

## 2020-09-29 LAB — CBC WITH DIFFERENTIAL/PLATELET
Abs Immature Granulocytes: 0.03 10*3/uL (ref 0.00–0.07)
Basophils Absolute: 0 10*3/uL (ref 0.0–0.1)
Basophils Relative: 0 %
Eosinophils Absolute: 0 10*3/uL (ref 0.0–0.5)
Eosinophils Relative: 0 %
HCT: 35.6 % — ABNORMAL LOW (ref 36.0–46.0)
Hemoglobin: 11.4 g/dL — ABNORMAL LOW (ref 12.0–15.0)
Immature Granulocytes: 1 %
Lymphocytes Relative: 9 %
Lymphs Abs: 0.6 10*3/uL — ABNORMAL LOW (ref 0.7–4.0)
MCH: 26.2 pg (ref 26.0–34.0)
MCHC: 32 g/dL (ref 30.0–36.0)
MCV: 81.8 fL (ref 80.0–100.0)
Monocytes Absolute: 0.1 10*3/uL (ref 0.1–1.0)
Monocytes Relative: 2 %
Neutro Abs: 5.6 10*3/uL (ref 1.7–7.7)
Neutrophils Relative %: 88 %
Platelets: 292 10*3/uL (ref 150–400)
RBC: 4.35 MIL/uL (ref 3.87–5.11)
RDW: 13.2 % (ref 11.5–15.5)
WBC: 6.3 10*3/uL (ref 4.0–10.5)
nRBC: 0 % (ref 0.0–0.2)

## 2020-09-29 LAB — COMPREHENSIVE METABOLIC PANEL
ALT: 87 U/L — ABNORMAL HIGH (ref 0–44)
AST: 137 U/L — ABNORMAL HIGH (ref 15–41)
Albumin: 2.6 g/dL — ABNORMAL LOW (ref 3.5–5.0)
Alkaline Phosphatase: 45 U/L (ref 38–126)
Anion gap: 10 (ref 5–15)
BUN: 5 mg/dL — ABNORMAL LOW (ref 6–20)
CO2: 28 mmol/L (ref 22–32)
Calcium: 8.4 mg/dL — ABNORMAL LOW (ref 8.9–10.3)
Chloride: 96 mmol/L — ABNORMAL LOW (ref 98–111)
Creatinine, Ser: 0.87 mg/dL (ref 0.44–1.00)
GFR, Estimated: >60 mL/min (ref >60)
Glucose, Bld: 149 mg/dL — ABNORMAL HIGH (ref 70–99)
Potassium: 4.5 mmol/L (ref 3.5–5.1)
Sodium: 134 mmol/L — ABNORMAL LOW (ref 135–145)
Total Bilirubin: 0.4 mg/dL (ref 0.3–1.2)
Total Protein: 6.7 g/dL (ref 6.5–8.1)

## 2020-09-29 LAB — GLUCOSE, CAPILLARY
Glucose-Capillary: 127 mg/dL — ABNORMAL HIGH (ref 70–99)
Glucose-Capillary: 151 mg/dL — ABNORMAL HIGH (ref 70–99)
Glucose-Capillary: 133 mg/dL — ABNORMAL HIGH (ref 70–99)
Glucose-Capillary: 174 mg/dL — ABNORMAL HIGH (ref 70–99)

## 2020-09-29 LAB — PHOSPHORUS: Phosphorus: 4.9 mg/dL — ABNORMAL HIGH (ref 2.5–4.6)

## 2020-09-29 LAB — LACTIC ACID, PLASMA: Lactic Acid, Venous: 0.8 mmol/L (ref 0.5–1.9)

## 2020-09-29 LAB — MAGNESIUM: Magnesium: 2.1 mg/dL (ref 1.7–2.4)

## 2020-09-29 LAB — D-DIMER, QUANTITATIVE: D-Dimer, Quant: 1.84 ug/mL-FEU — ABNORMAL HIGH (ref 0.00–0.50)

## 2020-09-29 LAB — FERRITIN: Ferritin: 217 ng/mL (ref 11–307)

## 2020-09-29 LAB — C-REACTIVE PROTEIN: CRP: 10.2 mg/dL — ABNORMAL HIGH (ref <1.0)

## 2020-09-29 MED ORDER — FAMOTIDINE 20 MG PO TABS
20.0000 mg | ORAL_TABLET | Freq: Every day | ORAL | Status: DC
  Administered 2020-09-29 – 2020-10-03 (×5): 20 mg via ORAL
  Filled 2020-09-29 (×5): qty 1

## 2020-09-29 MED ORDER — ALBUTEROL SULFATE HFA 108 (90 BASE) MCG/ACT IN AERS
2.0000 | INHALATION_SPRAY | Freq: Four times a day (QID) | RESPIRATORY_TRACT | Status: DC
  Administered 2020-09-29 – 2020-10-03 (×14): 2 via RESPIRATORY_TRACT
  Filled 2020-09-29: qty 6.7

## 2020-09-29 MED ORDER — BENZONATATE 100 MG PO CAPS
200.0000 mg | ORAL_CAPSULE | Freq: Three times a day (TID) | ORAL | Status: DC
  Administered 2020-09-29 – 2020-10-03 (×13): 200 mg via ORAL
  Filled 2020-09-29 (×13): qty 2

## 2020-09-29 MED ORDER — INSULIN ASPART 100 UNIT/ML ~~LOC~~ SOLN
0.0000 [IU] | Freq: Three times a day (TID) | SUBCUTANEOUS | Status: DC
  Administered 2020-09-29 (×2): 1 [IU] via SUBCUTANEOUS
  Administered 2020-09-29 – 2020-09-30 (×2): 2 [IU] via SUBCUTANEOUS
  Administered 2020-09-30: 19:00:00 1 [IU] via SUBCUTANEOUS
  Administered 2020-09-30: 11:00:00 2 [IU] via SUBCUTANEOUS
  Administered 2020-10-01 (×2): 3 [IU] via SUBCUTANEOUS
  Administered 2020-10-01: 09:00:00 2 [IU] via SUBCUTANEOUS
  Administered 2020-10-02: 17:00:00 3 [IU] via SUBCUTANEOUS
  Administered 2020-10-02: 09:00:00 2 [IU] via SUBCUTANEOUS
  Administered 2020-10-02 – 2020-10-03 (×2): 1 [IU] via SUBCUTANEOUS

## 2020-09-29 MED ORDER — METHYLPREDNISOLONE SODIUM SUCC 125 MG IJ SOLR
60.0000 mg | Freq: Two times a day (BID) | INTRAMUSCULAR | Status: DC
  Administered 2020-09-29 – 2020-10-03 (×9): 60 mg via INTRAVENOUS
  Filled 2020-09-29 (×9): qty 2

## 2020-09-29 MED ORDER — ALBUTEROL SULFATE HFA 108 (90 BASE) MCG/ACT IN AERS
2.0000 | INHALATION_SPRAY | RESPIRATORY_TRACT | Status: DC | PRN
  Filled 2020-09-29: qty 6.7

## 2020-09-29 MED ORDER — SODIUM CHLORIDE 0.9 % IV SOLN
1000.0000 mL | INTRAVENOUS | Status: DC
  Administered 2020-09-29: 14:00:00 1000 mL via INTRAVENOUS

## 2020-09-29 NOTE — Progress Notes (Signed)
PROGRESS NOTE                                                                                                                                                                                                             Patient Demographics:    Lauren Delacruz, is a 29 y.o. female, DOB - [birth year], MAU:[id number]  Outpatient Primary MD for the [facility] is [facility]   Admit date - 09/28/2020   LOS - 0  Chief Complaint  Patient presents with   Covid Positive   Respiratory Distress       Brief Narrative: Patient is a 29 y.o. female with no significant past medical history-presented with acute hypoxic respiratory failure due to COVID-19 pneumonia.  COVID-19 vaccinated status: Unvaccinated  Significant Events: 12/15>> Admit to [facility] for hypoxia due to COVID-19  Significant studies: 12/15>>Chest x-ray: Multifocal pneumonia  COVID-19 medications: Steroids: 12/15>> Remdesivir: 12/15>>  Antibiotics: None  Microbiology data: 12/15 >>blood culture: Pending  Procedures: None  Consults: None  DVT prophylaxis: enoxaparin (LOVENOX) injection 40 mg Start: 09/28/20 2145    Subjective:    Lauren Delacruz today continues to have coughing spells-stable on just 1-2 L of oxygen this morning.   Assessment  & Plan :   Acute Hypoxic Resp Failure due to Covid 19 Viral pneumonia: Appears to have mild disease-continue steroid/Remdesivir.  Coughing spell continues-I have added antitussives.  She appears comfortable otherwise.  If in the unlikely event she has worsening hypoxemia-she has consented to the use of Actemra/baricitinib.  Note-rationale/risk/benefits of Actemra/baricitinib discussed in detail-she does not have a history of tuberculosis, hepatitis B, diverticulitis-is aware that these agents are on the EUA by FDA-and consents to the use of these agents if hypoxia were to worsen.  Fever: afebrile  O2  requirements:  SpO2: 94 % O2 Flow Rate (L/min): 3 L/min   COVID-19 Labs: Recent Labs    09/28/20 2214 09/29/20 0457  DDIMER 2.39* 1.84*  FERRITIN 262 217  LDH 502*  --   CRP 10.3* 10.2*    No results found for: BNP  Recent Labs  Lab 09/28/20 2214  PROCALCITON 0.14    Lab Results  Component Value Date   SARSCOV2NAA POSITIVE (A) 09/28/2020   SARSCOV2NAA NEGATIVE 03/13/2020   SARSCOV2NAA NEGATIVE 01/04/2020     Prone/Incentive Spirometry: encouraged e incentive spirometry use 3-4/hour.  Transaminitis: Secondary to COVID-19-follow.  Hyponatremia: Mild-no further work-up required given her mild test.  We will continue to follow closely.  Obesity: Estimated body mass index is 30.3 kg/m as calculated from the following:   Height as of this encounter: 5\' 4"  (1.626 m).   Weight as of this encounter: 80.1 kg.     GI prophylaxis: H2 Blocker  ABG: No results found for: PHART, PCO2ART, PO2ART, HCO3, TCO2, ACIDBASEDEF, O2SAT  Vent Settings: N/A    Condition - Stable  Family Communication  : Patient to update family herself.  Code Status :  Full Code  Diet :  Diet Order            Diet regular Room service appropriate? Yes; Fluid consistency: Thin  Diet effective now                  Disposition Plan  :   Status is: Inpatient  Remains inpatient appropriate because:Inpatient level of care appropriate due to severity of illness   Dispo: The patient is from: Home              Anticipated d/c is to: Home              Anticipated d/c date is: 2 days              Patient currently is not medically stable to d/c.    Barriers to discharge: Hypoxia requiring O2 supplementation/complete 5 days of IV Remdesivir  Antimicorbials  :    Anti-infectives (From admission, onward)   Start     Dose/Rate Route Frequency Ordered Stop   09/29/20 1000  remdesivir 100 mg in sodium chloride 0.9 % 100 mL IVPB       "Followed by" Linked Group Details   100 mg 200 mL/hr  over 30 Minutes Intravenous Daily 09/28/20 2023 10/03/20 0959   09/28/20 2030  remdesivir 200 mg in sodium chloride 0.9% 250 mL IVPB       "Followed by" Linked Group Details   200 mg 580 mL/hr over 30 Minutes Intravenous Once 09/28/20 2023 09/29/20 0014      Inpatient Medications  Scheduled Meds:  albuterol  2 puff Inhalation Q6H   benzonatate  200 mg Oral TID   enoxaparin (LOVENOX) injection  40 mg Subcutaneous Q24H   famotidine  20 mg Oral Daily   insulin aspart  0-9 Units Subcutaneous TID WC   methylPREDNISolone (SOLU-MEDROL) injection  60 mg Intravenous Q12H   ondansetron (ZOFRAN) IV  4 mg Intravenous Once   sodium chloride flush  3 mL Intravenous Q12H   Continuous Infusions:  sodium chloride     remdesivir 100 mg in NS 100 mL     PRN Meds:.acetaminophen, albuterol, chlorpheniramine-HYDROcodone, guaiFENesin-dextromethorphan, polyethylene glycol   Time Spent in minutes  25  See all Orders from today for further details   Jeoffrey MassedShanker Ella Golomb M.D on 09/29/2020 at 10:16 AM  To page go to [email or web address] - use universal password  [facility] -  Office  [phone]    Objective:   Vitals:   09/29/20 0033 09/29/20 0115 09/29/20 0254 09/29/20 0731  BP: 109/61 109/61 125/80 127/83  Pulse: (!) 113 (!) 115 (!) 103 85  Resp: (!) 23 (!) 30 20 20   Temp:  (!) 102 F (38.9 C) 99 F (37.2 C) 98.2 F (36.8 C)  TempSrc:  Oral Oral Oral  SpO2: 99% 92% 94% 94%  Weight:   80.1 kg   Height:   5\' 4"  (1.626  m)     Wt Readings from Last 3 Encounters:  09/29/20 80.1 kg  07/26/20 85.4 kg  05/10/20 88.7 kg     Intake/Output Summary (Last 24 hours) at 09/29/2020 1016 Last data filed at 09/29/2020 6283 Gross per 24 hour  Intake 820.97 ml  Output --  Net 820.97 ml     Physical Exam Gen Exam:Alert awake-not in any distress HEENT:atraumatic, normocephalic Chest: B/L clear to auscultation anteriorly CVS:S1S2 regular Abdomen:soft non tender, non  distended Extremities:no edema Neurology: Non focal Skin: no rash   Data Review:    CBC Recent Labs  Lab 09/28/20 1519 09/28/20 2214 09/29/20 0457  WBC 7.7 7.6 6.3  HGB 13.0 12.5 11.4*  HCT 38.4 39.1 35.6*  PLT 322 311 292  MCV 80.7 82.7 81.8  MCH 27.3 26.4 26.2  MCHC 33.9 32.0 32.0  RDW 13.1 13.2 13.2  LYMPHSABS  --  1.2 0.6*  MONOABS  --  0.1 0.1  EOSABS  --  0.0 0.0  BASOSABS  --  0.0 0.0    Chemistries  Recent Labs  Lab 09/28/20 1519 09/28/20 2214 09/29/20 0457  NA 131* 130* 134*  K 3.7 3.7 4.5  CL 93* 91* 96*  CO2 27 27 28   GLUCOSE 124* 102* 149*  BUN 5* 5* 5*  CREATININE 0.87 0.79 0.87  CALCIUM 8.6* 8.3* 8.4*  MG  --   --  2.1  AST  --  157* 137*  ALT  --  92* 87*  ALKPHOS  --  51 45  BILITOT  --  0.6 0.4   ------------------------------------------------------------------------------------------------------------------ Recent Labs    09/28/20 2214  TRIG 80    Lab Results  Component Value Date   HGBA1C 5.5 10/19/2019   ------------------------------------------------------------------------------------------------------------------ No results for input(s): TSH, T4TOTAL, T3FREE, THYROIDAB in the last 72 hours.  Invalid input(s): FREET3 ------------------------------------------------------------------------------------------------------------------ Recent Labs    09/28/20 2214 09/29/20 0457  FERRITIN 262 217    Coagulation profile No results for input(s): INR, PROTIME in the last 168 hours.  Recent Labs    09/28/20 2214 09/29/20 0457  DDIMER 2.39* 1.84*    Cardiac Enzymes No results for input(s): CKMB, TROPONINI, MYOGLOBIN in the last 168 hours.  Invalid input(s): CK ------------------------------------------------------------------------------------------------------------------ No results found for: BNP  Micro Results Recent Results (from the past 240 hour(s))  Resp Panel by RT-PCR (Flu A&B, Covid) Nasopharyngeal Swab      Status: Abnormal   Collection Time: 09/28/20  9:09 PM   Specimen: Nasopharyngeal Swab; Nasopharyngeal(NP) swabs in vial transport medium  Result Value Ref Range Status   SARS Coronavirus 2 by RT PCR POSITIVE (A) NEGATIVE Final    Comment: RESULT CALLED TO, READ BACK BY AND VERIFIED WITH: L VENEGAS RN 09/28/20 2335 JDW (NOTE) SARS-CoV-2 target nucleic acids are DETECTED.  The SARS-CoV-2 RNA is generally detectable in upper respiratory specimens during the acute phase of infection. Positive results are indicative of the presence of the identified virus, but do not rule out bacterial infection or co-infection with other pathogens not detected by the test. Clinical correlation with patient history and other diagnostic information is necessary to determine patient infection status. The expected result is Negative.  Fact Sheet for Patients: 2336  Fact Sheet for Healthcare Providers: BloggerCourse.com  This test is not yet approved or cleared by the [place] FDA and  has been authorized for detection and/or diagnosis of SARS-CoV-2 by FDA under an Emergency Use Authorization (EUA).  This EUA will remain in effect (meaning this test  can be [place] ed) for the duration of  the COVID-19 declaration under Section 564(b)(1) of the Act, 21 U.S.C. section 360bbb-3(b)(1), unless the authorization is terminated or revoked sooner.     Influenza A by PCR NEGATIVE NEGATIVE Final   Influenza B by PCR NEGATIVE NEGATIVE Final    Comment: (NOTE) The Xpert Xpress SARS-CoV-2/FLU/RSV plus assay is intended as an aid in the diagnosis of influenza from Nasopharyngeal swab specimens and should not be used as a sole basis for treatment. Nasal washings and aspirates are unacceptable for Xpert Xpress SARS-CoV-2/FLU/RSV testing.  Fact Sheet for Patients: BloggerCourse.com  Fact Sheet for Healthcare  Providers: SeriousBroker.it  This test is not yet approved or cleared by the [place] FDA and has been authorized for detection and/or diagnosis of SARS-CoV-2 by FDA under an Emergency Use Authorization (EUA). This EUA will remain in effect (meaning this test can be used) for the duration of the COVID-19 declaration under Section 564(b)(1) of the Act, 21 U.S.C. section 360bbb-3(b)(1), unless the authorization is terminated or revoked.  Performed at [facility], [address] [place], [place] [address]     Radiology Reports CT Angio Chest PE W and/or Wo Contrast  Result Date: 09/28/2020 CLINICAL DATA:  COVID cough difficulty breathing EXAM: CT ANGIOGRAPHY CHEST WITH CONTRAST TECHNIQUE: Multidetector CT imaging of the chest was performed using the standard protocol during bolus administration of intravenous contrast. Multiplanar CT image reconstructions and MIPs were obtained to evaluate the vascular anatomy. CONTRAST:  60 mL OMNIPAQUE IOHEXOL 350 MG/ML SOLN COMPARISON:  Chest x-ray 09/28/2020 FINDINGS: Cardiovascular: Satisfactory opacification of the pulmonary arteries to the segmental level. No evidence of pulmonary embolism. Nonaneurysmal aorta. Normal cardiac size. No pericardial effusion. Mediastinum/Nodes: No enlarged mediastinal, hilar, or axillary lymph nodes. Thyroid gland, trachea, and esophagus demonstrate no significant findings. Lungs/Pleura: Fairly extensive bilateral ground-glass densities and consolidations within the right greater than left lungs. No pleural effusion or pneumothorax. Upper Abdomen: No acute abnormality. Musculoskeletal: No chest wall abnormality. No acute or significant osseous findings. Review of the MIP images confirms the above findings. IMPRESSION: 1. Negative for acute pulmonary embolus. 2. Fairly extensive bilateral ground-glass densities and consolidations within the right greater than left lungs, consistent with  bilateral pneumonia and history of COVID positivity. Electronically Signed   By: Jasmine Pang M.D.   On: 09/28/2020 21:30   DG Chest Portable 1 View  Result Date: 09/28/2020 CLINICAL DATA:  COVID positive. Chest congestion. Productive cough. EXAM: PORTABLE CHEST 1 VIEW COMPARISON:  None. FINDINGS: Heart size is normal. Lung volumes are low. Patchy bilateral airspace opacities are present in the right middle lobe and left lower lobe predominantly. Left effusion is present. Axial skeleton is unremarkable. IMPRESSION: 1. Patchy bilateral airspace disease compatible with multifocal pneumonia. 2. Left pleural effusion. 3. Low lung volumes. Electronically Signed   By: Marin Roberts M.D.   On: 09/28/2020 15:38

## 2020-09-29 NOTE — TOC Initial Note (Signed)
Transition of Care [facility]) - Initial/Assessment Note    Patient Details  Name: Lauren Delacruz MRN: [id number] Date of Birth: January 21, 1991  Transition of Care [facility]) CM/SW Contact:    Lockie Pares, RN Phone Number: 09/29/2020, 9:56 AM  Clinical Narrative:                 Initial COVID symptoms December 5th, with worsening SHOB, hypoxia, pneumonia, on 4LPM [place] oxygen, saturations 93% treating with IV medications Remdisivir, solumedrol.  Post COVID appointment made on patient instructions. CM will follow for needs.   Expected Discharge Plan: Home/Self Care Barriers to Discharge: Continued Medical Work up   Patient Goals and CMS Choice        Expected Discharge Plan and Services Expected Discharge Plan: Home/Self Care   Discharge Planning Services: CM Consult   Living arrangements for the past 2 months: Single Family Home                                      Prior Living Arrangements/Services Living arrangements for the past 2 months: Single Family Home   Patient language and need for interpreter reviewed:: Yes        Need for Family Participation in Patient Care: Yes (Comment) Care giver support system in place?: Yes (comment)   Criminal Activity/Legal Involvement Pertinent to Current Situation/Hospitalization: No - Comment as needed  Activities of Daily Living Home Assistive Devices/Equipment: None ADL Screening (condition at time of admission) Patient's cognitive ability adequate to safely complete daily activities?: Yes Is the patient deaf or have difficulty hearing?: No Does the patient have difficulty seeing, even when wearing glasses/contacts?: No Does the patient have difficulty concentrating, remembering, or making decisions?: No Patient able to express need for assistance with ADLs?: Yes Does the patient have difficulty dressing or bathing?: No Independently performs ADLs?: Yes (appropriate for developmental age) Does the patient have difficulty walking  or climbing stairs?: No Weakness of Legs: None Weakness of Arms/Hands: None  Permission Sought/Granted                  Emotional Assessment       Orientation: : Oriented to Situation,Oriented to  Time,Oriented to Place,Oriented to Self Alcohol / Substance Use: Not Applicable Psych Involvement: No (comment)  Admission diagnosis:  Acute hypoxemic respiratory failure due to COVID-19 (HCC) [U07.1, J96.01] Pneumonia due to COVID-19 virus [U07.1, J12.82] Patient Active Problem List   Diagnosis Date Noted  . Pneumonia due to COVID-19 virus 09/28/2020  . Acute respiratory failure with hypoxia (HCC) 09/28/2020  . Hyponatremia 09/28/2020  . Preterm premature rupture of membranes (PPROM) with onset of labor within 24 hours of rupture in third trimester, antepartum 03/13/2020  . SVD (spontaneous vaginal delivery) 03/13/2020  . Alpha thalassemia silent carrier 11/10/2019  . Anemia in pregnancy 10/20/2019  . History of gestational diabetes mellitus (GDM)    PCP:  [facility] Pharmacy:   [id number] [place], [place] - [address] AT [facility] [address] [facility] [address] [place] [address] Phone: [phone] Fax: [phone]     Social Determinants of Health (SDOH) Interventions    Readmission Risk Interventions No flowsheet data found.

## 2020-09-29 NOTE — Progress Notes (Signed)
Patient admitted to the on 3L [place]. Pt Vitals WNL. Pt states 0/10 pain. Pt belongings at bedside. Pt oriented to room. Call light within reach. Bath completed and skin intact.

## 2020-09-29 NOTE — Progress Notes (Signed)
Patient not in any distress, incentive spirometer (IS) given to patient with a goal of 1250 for today. Patient encouraged to use IS at least 10x Q1. Patient breast pump ordered and given to patient around 3pm.

## 2020-09-30 LAB — CBC WITH DIFFERENTIAL/PLATELET
Abs Immature Granulocytes: 0.09 10*3/uL — ABNORMAL HIGH (ref 0.00–0.07)
Basophils Absolute: 0 10*3/uL (ref 0.0–0.1)
Basophils Relative: 0 %
Eosinophils Absolute: 0 10*3/uL (ref 0.0–0.5)
Eosinophils Relative: 0 %
HCT: 33.7 % — ABNORMAL LOW (ref 36.0–46.0)
Hemoglobin: 11 g/dL — ABNORMAL LOW (ref 12.0–15.0)
Immature Granulocytes: 1 %
Lymphocytes Relative: 16 %
Lymphs Abs: 1.1 10*3/uL (ref 0.7–4.0)
MCH: 26.5 pg (ref 26.0–34.0)
MCHC: 32.6 g/dL (ref 30.0–36.0)
MCV: 81.2 fL (ref 80.0–100.0)
Monocytes Absolute: 0.2 10*3/uL (ref 0.1–1.0)
Monocytes Relative: 4 %
Neutro Abs: 5.4 10*3/uL (ref 1.7–7.7)
Neutrophils Relative %: 79 %
Platelets: 359 10*3/uL (ref 150–400)
RBC: 4.15 MIL/uL (ref 3.87–5.11)
RDW: 13.1 % (ref 11.5–15.5)
WBC: 6.8 10*3/uL (ref 4.0–10.5)
nRBC: 0 % (ref 0.0–0.2)

## 2020-09-30 LAB — COMPREHENSIVE METABOLIC PANEL
ALT: 110 U/L — ABNORMAL HIGH (ref 0–44)
AST: 124 U/L — ABNORMAL HIGH (ref 15–41)
Albumin: 2.5 g/dL — ABNORMAL LOW (ref 3.5–5.0)
Alkaline Phosphatase: 48 U/L (ref 38–126)
Anion gap: 11 (ref 5–15)
BUN: 10 mg/dL (ref 6–20)
CO2: 28 mmol/L (ref 22–32)
Calcium: 8.8 mg/dL — ABNORMAL LOW (ref 8.9–10.3)
Chloride: 99 mmol/L (ref 98–111)
Creatinine, Ser: 0.63 mg/dL (ref 0.44–1.00)
GFR, Estimated: >60 mL/min (ref >60)
Glucose, Bld: 156 mg/dL — ABNORMAL HIGH (ref 70–99)
Potassium: 4 mmol/L (ref 3.5–5.1)
Sodium: 138 mmol/L (ref 135–145)
Total Bilirubin: 0.4 mg/dL (ref 0.3–1.2)
Total Protein: 6.9 g/dL (ref 6.5–8.1)

## 2020-09-30 LAB — HEMOGLOBIN A1C
Hgb A1c MFr Bld: 6 % — ABNORMAL HIGH (ref 4.8–5.6)
Mean Plasma Glucose: 125.5 mg/dL

## 2020-09-30 LAB — GLUCOSE, CAPILLARY
Glucose-Capillary: 164 mg/dL — ABNORMAL HIGH (ref 70–99)
Glucose-Capillary: 170 mg/dL — ABNORMAL HIGH (ref 70–99)
Glucose-Capillary: 142 mg/dL — ABNORMAL HIGH (ref 70–99)
Glucose-Capillary: 193 mg/dL — ABNORMAL HIGH (ref 70–99)

## 2020-09-30 LAB — FERRITIN: Ferritin: 268 ng/mL (ref 11–307)

## 2020-09-30 LAB — C-REACTIVE PROTEIN: CRP: 7.7 mg/dL — ABNORMAL HIGH (ref <1.0)

## 2020-09-30 LAB — D-DIMER, QUANTITATIVE: D-Dimer, Quant: 1.22 ug/mL-FEU — ABNORMAL HIGH (ref 0.00–0.50)

## 2020-09-30 NOTE — Progress Notes (Signed)
SATURATION QUALIFICATIONS: (This note is used to comply with regulatory documentation for home oxygen)  Patient Saturations on Room Air at Rest = 82-83%  Patient Saturations on Room Air while Ambulating = 78%  Patient Saturations on 3 Liters of oxygen while Ambulating = 94-96%

## 2020-09-30 NOTE — Progress Notes (Signed)
PROGRESS NOTE                                                                                                                                                                                                             Patient Demographics:    Lauren Delacruz, is a 29 y.o. female, DOB - [birth year], RSW:[id number]  Outpatient [facility]   Admit date - 09/28/2020   LOS - 1  Chief Complaint  Patient presents with  . Covid Positive  . Respiratory Distress       Brief Narrative: Patient is a 29 y.o. female with no significant past medical history-presented with acute hypoxic respiratory failure due to COVID-19 pneumonia.  COVID-19 vaccinated status: Unvaccinated  Significant Events: 12/15>> Admit to [facility] for hypoxia due to COVID-19  Significant studies: 12/15>>Chest x-ray: Multifocal pneumonia  COVID-19 medications: Steroids: 12/15>> Remdesivir: 12/15>>  Antibiotics: None  Microbiology data: 12/15 >>blood culture: Pending  Procedures: None  Consults: None  DVT prophylaxis: enoxaparin (LOVENOX) injection 40 mg Start: 09/28/20 2145    Subjective:   Stable on just 2 L of oxygen-main issue persistent coughing spells that she continues to get.   Assessment  & Plan :   Acute Hypoxic Resp Failure due to Covid 19 Viral pneumonia: Mild hypoxemia-has significant coughing spells-continue antitussives.  Remains on steroids/Actemra.  Inflammatory markers downtrending-if in the unlikely event that she has worsening hypoxemia-she has consented to the use of Actemra/baricitinib.    Note-rationale/risk/benefits of Actemra/baricitinib discussed in detail-she does not have a history of tuberculosis, hepatitis B, diverticulitis-is aware that these agents are on the EUA by FDA-and consents to the use of these agents if hypoxia were to worsen.  Fever: afebrile  O2 requirements:   SpO2: 94 % O2 Flow Rate (L/min): 1 L/min   COVID-19 Labs: Recent Labs    09/28/20 2214 09/29/20 0457 09/30/20 0217  DDIMER 2.39* 1.84* 1.22*  FERRITIN 262 217 268  LDH 502*  --   --   CRP 10.3* 10.2* 7.7*    No results found for: BNP  Recent Labs  Lab 09/28/20 2214  PROCALCITON 0.14    Lab Results  Component Value Date   SARSCOV2NAA POSITIVE (A) 09/28/2020   SARSCOV2NAA NEGATIVE 03/13/2020   SARSCOV2NAA NEGATIVE 01/04/2020     Prone/Incentive Spirometry: encouraged e incentive  spirometry use 3-4/hour.  Transaminitis: Secondary to COVID-19-mild-downtrending-follow periodically.  Hyponatremia: Resolved.  Obesity: Estimated body mass index is 30.3 kg/m as calculated from the following:   Height as of this encounter: 5\' 4"  (1.626 m).   Weight as of this encounter: 80.1 kg.     GI prophylaxis: H2 Blocker  ABG: No results found for: PHART, PCO2ART, PO2ART, HCO3, TCO2, ACIDBASEDEF, O2SAT  Vent Settings: N/A    Condition - Stable  Family Communication  : Patient prefers to update family herself.  Code Status :  Full Code  Diet :  Diet Order            Diet regular Room service appropriate? Yes; Fluid consistency: Thin  Diet effective now                  Disposition Plan  :   Status is: Inpatient  Remains inpatient appropriate because:Inpatient level of care appropriate due to severity of illness   Dispo: The patient is from: Home              Anticipated d/c is to: Home              Anticipated d/c date is: 2 days              Patient currently is not medically stable to d/c.    Barriers to discharge: Hypoxia requiring O2 supplementation/complete 5 days of IV Remdesivir  Antimicorbials  :    Anti-infectives (From admission, onward)   Start     Dose/Rate Route Frequency Ordered Stop   09/29/20 1000  remdesivir 100 mg in sodium chloride 0.9 % 100 mL IVPB       "Followed by" Linked Group Details   100 mg 200 mL/hr over 30 Minutes  Intravenous Daily 09/28/20 2023 10/03/20 0959   09/28/20 2030  remdesivir 200 mg in sodium chloride 0.9% 250 mL IVPB       "Followed by" Linked Group Details   200 mg 580 mL/hr over 30 Minutes Intravenous Once 09/28/20 2023 09/29/20 0014      Inpatient Medications  Scheduled Meds: . albuterol  2 puff Inhalation Q6H  . benzonatate  200 mg Oral TID  . enoxaparin (LOVENOX) injection  40 mg Subcutaneous Q24H  . famotidine  20 mg Oral Daily  . insulin aspart  0-9 Units Subcutaneous TID WC  . methylPREDNISolone (SOLU-MEDROL) injection  60 mg Intravenous Q12H  . ondansetron (ZOFRAN) IV  4 mg Intravenous Once  . sodium chloride flush  3 mL Intravenous Q12H   Continuous Infusions: . sodium chloride 1,000 mL (09/29/20 1341)  . remdesivir 100 mg in NS 100 mL 100 mg (09/30/20 1045)   PRN Meds:.acetaminophen, albuterol, chlorpheniramine-HYDROcodone, guaiFENesin-dextromethorphan, polyethylene glycol   Time Spent in minutes  25  See all Orders from today for further details   10/02/20 M.D on 09/30/2020 at 2:57 PM  To page go to [email or web address] - use universal password  [facility] -  Office  [phone]    Objective:   Vitals:   09/29/20 2056 09/29/20 2200 09/30/20 0454 09/30/20 1300  BP: (!) 132/93  117/78 137/75  Pulse: 98  98 96  Resp: 20  17 19   Temp: 99.6 F (37.6 C)  97.8 F (36.6 C)   TempSrc: Oral  Oral   SpO2: 91% 92% 94% 94%  Weight:      Height:        Wt Readings from Last 3 Encounters:  09/29/20 80.1 kg  07/26/20 85.4 kg  05/10/20 88.7 kg     Intake/Output Summary (Last 24 hours) at 09/30/2020 1457 Last data filed at 09/30/2020 1430 Gross per 24 hour  Intake 993.17 ml  Output --  Net 993.17 ml     Physical Exam Gen Exam:Alert awake-not in any distress HEENT:atraumatic, normocephalic Chest: B/L clear to auscultation anteriorly CVS:S1S2 regular Abdomen:soft non tender, non distended Extremities:no edema Neurology: Non focal Skin:  no rash   Data Review:    CBC Recent Labs  Lab 09/28/20 1519 09/28/20 2214 09/29/20 0457 09/30/20 0217  WBC 7.7 7.6 6.3 6.8  HGB 13.0 12.5 11.4* 11.0*  HCT 38.4 39.1 35.6* 33.7*  PLT 322 311 292 359  MCV 80.7 82.7 81.8 81.2  MCH 27.3 26.4 26.2 26.5  MCHC 33.9 32.0 32.0 32.6  RDW 13.1 13.2 13.2 13.1  LYMPHSABS  --  1.2 0.6* 1.1  MONOABS  --  0.1 0.1 0.2  EOSABS  --  0.0 0.0 0.0  BASOSABS  --  0.0 0.0 0.0    Chemistries  Recent Labs  Lab 09/28/20 1519 09/28/20 2214 09/29/20 0457 09/30/20 0217  NA 131* 130* 134* 138  K 3.7 3.7 4.5 4.0  CL 93* 91* 96* 99  CO2 27 27 28 28   GLUCOSE 124* 102* 149* 156*  BUN 5* 5* 5* 10  CREATININE 0.87 0.79 0.87 0.63  CALCIUM 8.6* 8.3* 8.4* 8.8*  MG  --   --  2.1  --   AST  --  157* 137* 124*  ALT  --  92* 87* 110*  ALKPHOS  --  51 45 48  BILITOT  --  0.6 0.4 0.4   ------------------------------------------------------------------------------------------------------------------ Recent Labs    09/28/20 2214  TRIG 80    Lab Results  Component Value Date   HGBA1C 6.0 (H) 09/30/2020   ------------------------------------------------------------------------------------------------------------------ No results for input(s): TSH, T4TOTAL, T3FREE, THYROIDAB in the last 72 hours.  Invalid input(s): FREET3 ------------------------------------------------------------------------------------------------------------------ Recent Labs    09/29/20 0457 09/30/20 0217  FERRITIN 217 268    Coagulation profile No results for input(s): INR, PROTIME in the last 168 hours.  Recent Labs    09/29/20 0457 09/30/20 0217  DDIMER 1.84* 1.22*    Cardiac Enzymes No results for input(s): CKMB, TROPONINI, MYOGLOBIN in the last 168 hours.  Invalid input(s): CK ------------------------------------------------------------------------------------------------------------------ No results found for: BNP  Micro Results Recent Results (from  the past 240 hour(s))  Resp Panel by RT-PCR (Flu A&B, Covid) Nasopharyngeal Swab     Status: Abnormal   Collection Time: 09/28/20  9:09 PM   Specimen: Nasopharyngeal Swab; Nasopharyngeal(NP) swabs in vial transport medium  Result Value Ref Range Status   SARS Coronavirus 2 by RT PCR POSITIVE (A) NEGATIVE Final    Comment: RESULT CALLED TO, READ BACK BY AND VERIFIED WITH: L VENEGAS RN 09/28/20 2335 JDW (NOTE) SARS-CoV-2 target nucleic acids are DETECTED.  The SARS-CoV-2 RNA is generally detectable in upper respiratory specimens during the acute phase of infection. Positive results are indicative of the presence of the identified virus, but do not rule out bacterial infection or co-infection with other pathogens not detected by the test. Clinical correlation with patient history and other diagnostic information is necessary to determine patient infection status. The expected result is Negative.  Fact Sheet for Patients: BloggerCourse.comhttps://www.fda.gov/media/152166/download  Fact Sheet for Healthcare Providers: SeriousBroker.ithttps://www.fda.gov/media/152162/download  This test is not yet approved or cleared by the [place] FDA and  has been authorized for detection and/or diagnosis of SARS-CoV-2 by FDA  under an Emergency Use Authorization (EUA).  This EUA will remain in effect (meaning this test can be [place] ed) for the duration of  the COVID-19 declaration under Section 564(b)(1) of the Act, 21 U.S.C. section 360bbb-3(b)(1), unless the authorization is terminated or revoked sooner.     Influenza A by PCR NEGATIVE NEGATIVE Final   Influenza B by PCR NEGATIVE NEGATIVE Final    Comment: (NOTE) The Xpert Xpress SARS-CoV-2/FLU/RSV plus assay is intended as an aid in the diagnosis of influenza from Nasopharyngeal swab specimens and should not be used as a sole basis for treatment. Nasal washings and aspirates are unacceptable for Xpert Xpress SARS-CoV-2/FLU/RSV testing.  Fact Sheet for  Patients: BloggerCourse.com  Fact Sheet for Healthcare Providers: SeriousBroker.it  This test is not yet approved or cleared by the [place] FDA and has been authorized for detection and/or diagnosis of SARS-CoV-2 by FDA under an Emergency Use Authorization (EUA). This EUA will remain in effect (meaning this test can be used) for the duration of the COVID-19 declaration under Section 564(b)(1) of the Act, 21 U.S.C. section 360bbb-3(b)(1), unless the authorization is terminated or revoked.  Performed at [facility], [address] [place], [place] [address]   Blood Culture (routine x 2)     Status: None (Preliminary result)   Collection Time: 09/28/20  9:10 PM   Specimen: BLOOD  Result Value Ref Range Status   Specimen Description BLOOD LEFT ANTECUBITAL  Final   Special Requests   Final    BOTTLES DRAWN AEROBIC AND ANAEROBIC Blood Culture adequate volume   Culture   Final    NO GROWTH 2 DAYS Performed at [facility], [address] [place], [place] [address]    Report Status PENDING  Incomplete  Blood Culture (routine x 2)     Status: None (Preliminary result)   Collection Time: 09/28/20 10:08 PM   Specimen: BLOOD  Result Value Ref Range Status   Specimen Description BLOOD RIGHT ANTECUBITAL  Final   Special Requests   Final    BOTTLES DRAWN AEROBIC AND ANAEROBIC Blood Culture adequate volume   Culture   Final    NO GROWTH 2 DAYS Performed at [facility], [address], [place], [place] [address]    Report Status PENDING  Incomplete    Radiology Reports CT Angio Chest PE W and/or Wo Contrast  Result Date: 09/28/2020 CLINICAL DATA:  COVID cough difficulty breathing EXAM: CT ANGIOGRAPHY CHEST WITH CONTRAST TECHNIQUE: Multidetector CT imaging of the chest was performed using the standard protocol during bolus administration of intravenous contrast. Multiplanar CT image reconstructions and MIPs were  obtained to evaluate the vascular anatomy. CONTRAST:  60 mL OMNIPAQUE IOHEXOL 350 MG/ML SOLN COMPARISON:  Chest x-ray 09/28/2020 FINDINGS: Cardiovascular: Satisfactory opacification of the pulmonary arteries to the segmental level. No evidence of pulmonary embolism. Nonaneurysmal aorta. Normal cardiac size. No pericardial effusion. Mediastinum/Nodes: No enlarged mediastinal, hilar, or axillary lymph nodes. Thyroid gland, trachea, and esophagus demonstrate no significant findings. Lungs/Pleura: Fairly extensive bilateral ground-glass densities and consolidations within the right greater than left lungs. No pleural effusion or pneumothorax. Upper Abdomen: No acute abnormality. Musculoskeletal: No chest wall abnormality. No acute or significant osseous findings. Review of the MIP images confirms the above findings. IMPRESSION: 1. Negative for acute pulmonary embolus. 2. Fairly extensive bilateral ground-glass densities and consolidations within the right greater than left lungs, consistent with bilateral pneumonia and history of COVID positivity. Electronically Signed   By: Selena Batten  Jake Samples M.D.   On: 09/28/2020 21:30   DG Chest Portable 1 View  Result Date: 09/28/2020 CLINICAL DATA:  COVID positive. Chest congestion. Productive cough. EXAM: PORTABLE CHEST 1 VIEW COMPARISON:  None. FINDINGS: Heart size is normal. Lung volumes are low. Patchy bilateral airspace opacities are present in the right middle lobe and left lower lobe predominantly. Left effusion is present. Axial skeleton is unremarkable. IMPRESSION: 1. Patchy bilateral airspace disease compatible with multifocal pneumonia. 2. Left pleural effusion. 3. Low lung volumes. Electronically Signed   By: Marin Roberts M.D.   On: 09/28/2020 15:38

## 2020-10-01 DIAGNOSIS — U071 COVID-19: Principal | ICD-10-CM

## 2020-10-01 DIAGNOSIS — J1282 Pneumonia due to coronavirus disease 2019: Secondary | ICD-10-CM

## 2020-10-01 LAB — C-REACTIVE PROTEIN: CRP: 3.4 mg/dL — ABNORMAL HIGH (ref <1.0)

## 2020-10-01 LAB — CBC WITH DIFFERENTIAL/PLATELET
Abs Immature Granulocytes: 0.18 10*3/uL — ABNORMAL HIGH (ref 0.00–0.07)
Basophils Absolute: 0 10*3/uL (ref 0.0–0.1)
Basophils Relative: 0 %
Eosinophils Absolute: 0 10*3/uL (ref 0.0–0.5)
Eosinophils Relative: 0 %
HCT: 33.6 % — ABNORMAL LOW (ref 36.0–46.0)
Hemoglobin: 10.8 g/dL — ABNORMAL LOW (ref 12.0–15.0)
Immature Granulocytes: 2 %
Lymphocytes Relative: 13 %
Lymphs Abs: 1.5 10*3/uL (ref 0.7–4.0)
MCH: 26.4 pg (ref 26.0–34.0)
MCHC: 32.1 g/dL (ref 30.0–36.0)
MCV: 82.2 fL (ref 80.0–100.0)
Monocytes Absolute: 0.4 10*3/uL (ref 0.1–1.0)
Monocytes Relative: 4 %
Neutro Abs: 9.3 10*3/uL — ABNORMAL HIGH (ref 1.7–7.7)
Neutrophils Relative %: 81 %
Platelets: 427 10*3/uL — ABNORMAL HIGH (ref 150–400)
RBC: 4.09 MIL/uL (ref 3.87–5.11)
RDW: 13.2 % (ref 11.5–15.5)
WBC: 11.5 10*3/uL — ABNORMAL HIGH (ref 4.0–10.5)
nRBC: 0 % (ref 0.0–0.2)

## 2020-10-01 LAB — COMPREHENSIVE METABOLIC PANEL
ALT: 112 U/L — ABNORMAL HIGH (ref 0–44)
AST: 88 U/L — ABNORMAL HIGH (ref 15–41)
Albumin: 2.5 g/dL — ABNORMAL LOW (ref 3.5–5.0)
Alkaline Phosphatase: 49 U/L (ref 38–126)
Anion gap: 10 (ref 5–15)
BUN: 12 mg/dL (ref 6–20)
CO2: 26 mmol/L (ref 22–32)
Calcium: 8.6 mg/dL — ABNORMAL LOW (ref 8.9–10.3)
Chloride: 101 mmol/L (ref 98–111)
Creatinine, Ser: 0.64 mg/dL (ref 0.44–1.00)
GFR, Estimated: >60 mL/min (ref >60)
Glucose, Bld: 176 mg/dL — ABNORMAL HIGH (ref 70–99)
Potassium: 4 mmol/L (ref 3.5–5.1)
Sodium: 137 mmol/L (ref 135–145)
Total Bilirubin: 0.4 mg/dL (ref 0.3–1.2)
Total Protein: 6.4 g/dL — ABNORMAL LOW (ref 6.5–8.1)

## 2020-10-01 LAB — FERRITIN: Ferritin: 248 ng/mL (ref 11–307)

## 2020-10-01 LAB — GLUCOSE, CAPILLARY
Glucose-Capillary: 171 mg/dL — ABNORMAL HIGH (ref 70–99)
Glucose-Capillary: 208 mg/dL — ABNORMAL HIGH (ref 70–99)
Glucose-Capillary: 245 mg/dL — ABNORMAL HIGH (ref 70–99)
Glucose-Capillary: 263 mg/dL — ABNORMAL HIGH (ref 70–99)

## 2020-10-01 LAB — D-DIMER, QUANTITATIVE: D-Dimer, Quant: 0.71 ug/mL-FEU — ABNORMAL HIGH (ref 0.00–0.50)

## 2020-10-01 MED ORDER — BENZONATATE 100 MG PO CAPS: 200.0000 mg | ORAL_CAPSULE | Freq: Three times a day (TID) | ORAL | Status: DC | PRN

## 2020-10-01 NOTE — Progress Notes (Signed)
PROGRESS NOTE                                                                                                                                                                                                             Patient Demographics:    Lauren Delacruz, is a 29 y.o. female, DOB - [birth year], VWAxel FillerU:[id number]  Out[facility]   Admit date - 09/28/2020   LOS - 2  Chief Complaint  Patient presents with  . Covid Positive  . Respiratory Distress       Brief Narrative: Patient is a 29 y.o. female with no significant past medical history-presented with acute hypoxic respiratory failure due to COVID-19 pneumonia.  COVID-19 vaccinated status: Unvaccinated  Significant Events: 12/15>> [facility] due to COVID-19  Significant studies: 12/15>>Chest x-ray: Multifocal pneumonia  COVID-19 medications: Steroids: 12/15>> Remdesivir: 12/15>>  Antibiotics: None  Microbiology data: 12/15 >>blood culture: Pending  Procedures: None  Consults: None  DVT prophylaxis: enoxaparin (LOVENOX) injection 40 mg Start: 09/28/20 2145    Subjective:   Patient in bed, appears comfortable, denies any headache, no fever, no chest pain or pressure, ++ cough but no shortness of breath , no abdominal pain. No focal weakness.    Assessment  & Plan :   Acute Hypoxic Resp Failure due to Covid 19 Viral pneumonia: Mild hypoxemia-has significant coughing spells-continue antitussives.  Remains on steroids/Actemra.  Inflammatory markers downtrending-if in the unlikely event that she has worsening hypoxemia-she has consented to the use of Actemra/baricitinib.    Note-rationale/risk/benefits of Actemra/baricitinib discussed in detail-she does not have a history of tuberculosis, hepatitis B, diverticulitis-is aware that these agents are on the EUA by FDA-and consents to the use of these  agents if hypoxia were to worsen.  Fever: afebrile  O2 requirements:  SpO2: 97 % O2 Flow Rate (L/min): 3 L/min   Prone/Incentive Spirometry: encouraged e incentive spirometry use 3-4/hour.   Recent Labs  Lab 09/28/20 1519 09/28/20 2109 09/28/20 2214 09/28/20 2220 09/29/20 0447 09/29/20 0457 09/30/20 0217 10/01/20 0247  WBC 7.7  --  7.6  --   --  6.3 6.8 11.5*  HGB 13.0  --  12.5  --   --  11.4* 11.0* 10.8*  HCT 38.4  --  39.1  --   --  35.6* 33.7* 33.6*  PLT 322  --  311  --   --  292 359 427*  CRP  --   --  10.3*  --   --  10.2* 7.7* 3.4*  DDIMER  --   --  2.39*  --   --  1.84* 1.22* 0.71*  PROCALCITON  --   --  0.14  --   --   --   --   --   AST  --   --  157*  --   --  137* 124* 88*  ALT  --   --  92*  --   --  87* 110* 112*  ALKPHOS  --   --  51  --   --  45 48 49  BILITOT  --   --  0.6  --   --  0.4 0.4 0.4  ALBUMIN  --   --  2.9*  --   --  2.6* 2.5* 2.5*  LATICACIDVEN  --   --   --  1.3 0.8  --   --   --   SARSCOV2NAA  --  POSITIVE*  --   --   --   --   --   --        Transaminitis: Secondary to COVID-19-mild-downtrending-follow periodically.  Hyponatremia: Resolved.  Obesity: Estimated body mass index is 30.3 kg/m as calculated from the following:   Height as of this encounter: 5\' 4"  (1.626 m).   Weight as of this encounter: 80.1 kg.     GI prophylaxis: H2 Blocker       Condition - Stable  Family Communication  : Patient prefers to update family herself.  Code Status :  Full Code  Diet :  Diet Order            Diet regular Room service appropriate? Yes; Fluid consistency: Thin  Diet effective now                  Disposition Plan  :   Status is: Inpatient  Remains inpatient appropriate because:Inpatient level of care appropriate due to severity of illness   Dispo: The patient is from: Home              Anticipated d/c is to: Home              Anticipated d/c date is: 2 days              Patient currently is not medically stable to  d/c.    Barriers to discharge: Hypoxia requiring O2 supplementation/complete 5 days of IV Remdesivir  Antimicorbials  :    Anti-infectives (From admission, onward)   Start     Dose/Rate Route Frequency Ordered Stop   09/29/20 1000  remdesivir 100 mg in sodium chloride 0.9 % 100 mL IVPB       "Followed by" Linked Group Details   100 mg 200 mL/hr over 30 Minutes Intravenous Daily 09/28/20 2023 10/03/20 0959   09/28/20 2030  remdesivir 200 mg in sodium chloride 0.9% 250 mL IVPB       "Followed by" Linked Group Details   200 mg 580 mL/hr over 30 Minutes Intravenous Once 09/28/20 2023 09/29/20 0014      Inpatient Medications  Scheduled Meds: . albuterol  2 puff Inhalation Q6H  . benzonatate  200 mg Oral TID  . enoxaparin (LOVENOX) injection  40 mg Subcutaneous Q24H  . famotidine  20 mg Oral Daily  .  insulin aspart  0-9 Units Subcutaneous TID WC  . methylPREDNISolone (SOLU-MEDROL) injection  60 mg Intravenous Q12H  . ondansetron (ZOFRAN) IV  4 mg Intravenous Once  . sodium chloride flush  3 mL Intravenous Q12H   Continuous Infusions: . sodium chloride 1,000 mL (09/29/20 1341)  . remdesivir 100 mg in NS 100 mL 100 mg (10/01/20 1009)   PRN Meds:.acetaminophen, albuterol, chlorpheniramine-HYDROcodone, guaiFENesin-dextromethorphan, polyethylene glycol   Time Spent in minutes  25  See all Orders from today for further details   Susa Raring M.D on 10/01/2020 at 11:37 AM  To page go to [email or web address] - use universal password  [facility] -  Office  [phone]    Objective:   Vitals:   09/30/20 1044 09/30/20 1300 09/30/20 1404 09/30/20 2007  BP:  137/75  113/74  Pulse:  96  76  Resp:  19  20  Temp:    98.4 F (36.9 C)  TempSrc:    Oral  SpO2: 93% 94% 92% 97%  Weight:      Height:        Wt Readings from Last 3 Encounters:  09/29/20 80.1 kg  07/26/20 85.4 kg  05/10/20 88.7 kg     Intake/Output Summary (Last 24 hours) at 10/01/2020 1137 Last data  filed at 10/01/2020 0900 Gross per 24 hour  Intake 780 ml  Output --  Net 780 ml     Physical Exam  Awake Alert, No new F.N deficits, Normal affect [place].AT,PERRAL Supple Neck,No JVD, No cervical lymphadenopathy appriciated.  Symmetrical Chest wall movement, Good air movement bilaterally, CTAB RRR,No Gallops, Rubs or new Murmurs, No Parasternal Heave +ve B.Sounds, Abd Soft, No tenderness, No organomegaly appriciated, No rebound - guarding or rigidity. No Cyanosis, Clubbing or edema, No new Rash or bruise    Data Review:    CBC Recent Labs  Lab 09/28/20 1519 09/28/20 2214 09/29/20 0457 09/30/20 0217 10/01/20 0247  WBC 7.7 7.6 6.3 6.8 11.5*  HGB 13.0 12.5 11.4* 11.0* 10.8*  HCT 38.4 39.1 35.6* 33.7* 33.6*  PLT 322 311 292 359 427*  MCV 80.7 82.7 81.8 81.2 82.2  MCH 27.3 26.4 26.2 26.5 26.4  MCHC 33.9 32.0 32.0 32.6 32.1  RDW 13.1 13.2 13.2 13.1 13.2  LYMPHSABS  --  1.2 0.6* 1.1 1.5  MONOABS  --  0.1 0.1 0.2 0.4  EOSABS  --  0.0 0.0 0.0 0.0  BASOSABS  --  0.0 0.0 0.0 0.0    Chemistries  Recent Labs  Lab 09/28/20 1519 09/28/20 2214 09/29/20 0457 09/30/20 0217 10/01/20 0247  NA 131* 130* 134* 138 137  K 3.7 3.7 4.5 4.0 4.0  CL 93* 91* 96* 99 101  CO2 GLUCOSE 124* 102* 149* 156* 176*  BUN 5* 5* 5* 10 12  CREATININE 0.87 0.79 0.87 0.63 0.64  CALCIUM 8.6* 8.3* 8.4* 8.8* 8.6*  MG  --   --  2.1  --   --   AST  --  157* 137* 124* 88*  ALT  --  92* 87* 110* 112*  ALKPHOS  --  51 45 48 49  BILITOT  --  0.6 0.4 0.4 0.4   ------------------------------------------------------------------------------------------------------------------ Recent Labs    09/28/20 2214  TRIG 80    Lab Results  Component Value Date   HGBA1C 6.0 (H) 09/30/2020   ------------------------------------------------------------------------------------------------------------------ No results for input(s): TSH, T4TOTAL, T3FREE, THYROIDAB in the last 72 hours.  Invalid  input(s): FREET3 ------------------------------------------------------------------------------------------------------------------ Recent Labs  09/30/20 0217 10/01/20 0247  FERRITIN 268 248    Coagulation profile No results for input(s): INR, PROTIME in the last 168 hours.  Recent Labs    09/30/20 0217 10/01/20 0247  DDIMER 1.22* 0.71*    Cardiac Enzymes No results for input(s): CKMB, TROPONINI, MYOGLOBIN in the last 168 hours.  Invalid input(s): CK ------------------------------------------------------------------------------------------------------------------ No results found for: BNP  Micro Results Recent Results (from the past 240 hour(s))  Resp Panel by RT-PCR (Flu A&B, Covid) Nasopharyngeal Swab     Status: Abnormal   Collection Time: 09/28/20  9:09 PM   Specimen: Nasopharyngeal Swab; Nasopharyngeal(NP) swabs in vial transport medium  Result Value Ref Range Status   SARS Coronavirus 2 by RT PCR POSITIVE (A) NEGATIVE Final    Comment: RESULT CALLED TO, READ BACK BY AND VERIFIED WITH: L VENEGAS RN 09/28/20 2335 JDW (NOTE) SARS-CoV-2 target nucleic acids are DETECTED.  The SARS-CoV-2 RNA is generally detectable in upper respiratory specimens during the acute phase of infection. Positive results are indicative of the presence of the identified virus, but do not rule out bacterial infection or co-infection with other pathogens not detected by the test. Clinical correlation with patient history and other diagnostic information is necessary to determine patient infection status. The expected result is Negative.  Fact Sheet for Patients: BloggerCourse.com  Fact Sheet for Healthcare Providers: SeriousBroker.it  This test is not yet approved or cleared by the [place] FDA and  has been authorized for detection and/or diagnosis of SARS-CoV-2 by FDA under an Emergency Use Authorization (EUA).  This EUA  will remain in effect (meaning this test can be [place] ed) for the duration of  the COVID-19 declaration under Section 564(b)(1) of the Act, 21 U.S.C. section 360bbb-3(b)(1), unless the authorization is terminated or revoked sooner.     Influenza A by PCR NEGATIVE NEGATIVE Final   Influenza B by PCR NEGATIVE NEGATIVE Final    Comment: (NOTE) The Xpert Xpress SARS-CoV-2/FLU/RSV plus assay is intended as an aid in the diagnosis of influenza from Nasopharyngeal swab specimens and should not be used as a sole basis for treatment. Nasal washings and aspirates are unacceptable for Xpert Xpress SARS-CoV-2/FLU/RSV testing.  Fact Sheet for Patients: BloggerCourse.com  Fact Sheet for Healthcare Providers: SeriousBroker.it  This test is not yet approved or cleared by the [place] FDA and has been authorized for detection and/or diagnosis of SARS-CoV-2 by FDA under an Emergency Use Authorization (EUA). This EUA will remain in effect (meaning this test can be used) for the duration of the COVID-19 declaration under Section 564(b)(1) of the Act, 21 U.S.C. section 360bbb-3(b)(1), unless the authorization is terminated or revoked.  Performed at [facility], [address], [place], [place] [address]   Blood Culture (routine x 2)     Status: None (Preliminary result)   Collection Time: 09/28/20  9:10 PM   Specimen: BLOOD  Result Value Ref Range Status   Specimen Description BLOOD LEFT ANTECUBITAL  Final   Special Requests   Final    BOTTLES DRAWN AEROBIC AND ANAEROBIC Blood Culture adequate volume   Culture   Final    NO GROWTH 2 DAYS Performed at [facility], [address] [place], [place] [address]    Report Status PENDING  Incomplete  Blood Culture (routine x 2)     Status: None (Preliminary result)   Collection Time: 09/28/20 10:08 PM   Specimen: BLOOD  Result Value Ref Range Status   Specimen Description BLOOD  RIGHT ANTECUBITAL  Final   Special Requests   Final    BOTTLES DRAWN AEROBIC AND ANAEROBIC Blood Culture adequate volume   Culture   Final    NO GROWTH 2 DAYS Performed at [facility], [address], [place], [place] [address]    Report Status PENDING  Incomplete    Radiology Reports CT Angio Chest PE W and/or Wo Contrast  Result Date: 09/28/2020 CLINICAL DATA:  COVID cough difficulty breathing EXAM: CT ANGIOGRAPHY CHEST WITH CONTRAST TECHNIQUE: Multidetector CT imaging of the chest was performed using the standard protocol during bolus administration of intravenous contrast. Multiplanar CT image reconstructions and MIPs were obtained to evaluate the vascular anatomy. CONTRAST:  60 mL OMNIPAQUE IOHEXOL 350 MG/ML SOLN COMPARISON:  Chest x-ray 09/28/2020 FINDINGS: Cardiovascular: Satisfactory opacification of the pulmonary arteries to the segmental level. No evidence of pulmonary embolism. Nonaneurysmal aorta. Normal cardiac size. No pericardial effusion. Mediastinum/Nodes: No enlarged mediastinal, hilar, or axillary lymph nodes. Thyroid gland, trachea, and esophagus demonstrate no significant findings. Lungs/Pleura: Fairly extensive bilateral ground-glass densities and consolidations within the right greater than left lungs. No pleural effusion or pneumothorax. Upper Abdomen: No acute abnormality. Musculoskeletal: No chest wall abnormality. No acute or significant osseous findings. Review of the MIP images confirms the above findings. IMPRESSION: 1. Negative for acute pulmonary embolus. 2. Fairly extensive bilateral ground-glass densities and consolidations within the right greater than left lungs, consistent with bilateral pneumonia and history of COVID positivity. Electronically Signed   By: Jasmine Pang M.D.   On: 09/28/2020 21:30   DG Chest Portable 1 View  Result Date: 09/28/2020 CLINICAL DATA:  COVID positive. Chest congestion. Productive cough. EXAM: PORTABLE CHEST 1 VIEW  COMPARISON:  None. FINDINGS: Heart size is normal. Lung volumes are low. Patchy bilateral airspace opacities are present in the right middle lobe and left lower lobe predominantly. Left effusion is present. Axial skeleton is unremarkable. IMPRESSION: 1. Patchy bilateral airspace disease compatible with multifocal pneumonia. 2. Left pleural effusion. 3. Low lung volumes. Electronically Signed   By: Marin Roberts M.D.   On: 09/28/2020 15:38

## 2020-10-01 NOTE — Progress Notes (Signed)
SATURATION QUALIFICATIONS: (This note is used to comply with regulatory documentation for home oxygen)  Patient Saturations on Room Air at Rest = 90%  Patient Saturations on Room Air while Ambulating = 85%  Patient Saturations on 2 Liters of oxygen while Ambulating = 93%  Please briefly explain why patient needs home oxygen: pt requiring o2 at this time

## 2020-10-02 DIAGNOSIS — U071 COVID-19: Secondary | ICD-10-CM | POA: Diagnosis not present

## 2020-10-02 DIAGNOSIS — J1282 Pneumonia due to coronavirus disease 2019: Secondary | ICD-10-CM | POA: Diagnosis not present

## 2020-10-02 LAB — GLUCOSE, CAPILLARY
Glucose-Capillary: 200 mg/dL — ABNORMAL HIGH (ref 70–99)
Glucose-Capillary: 128 mg/dL — ABNORMAL HIGH (ref 70–99)
Glucose-Capillary: 239 mg/dL — ABNORMAL HIGH (ref 70–99)
Glucose-Capillary: 234 mg/dL — ABNORMAL HIGH (ref 70–99)

## 2020-10-02 LAB — CBC WITH DIFFERENTIAL/PLATELET
Abs Immature Granulocytes: 0 10*3/uL (ref 0.00–0.07)
Basophils Absolute: 0 10*3/uL (ref 0.0–0.1)
Basophils Relative: 0 %
Eosinophils Absolute: 0 10*3/uL (ref 0.0–0.5)
Eosinophils Relative: 0 %
HCT: 34.2 % — ABNORMAL LOW (ref 36.0–46.0)
Hemoglobin: 11.1 g/dL — ABNORMAL LOW (ref 12.0–15.0)
Lymphocytes Relative: 13 %
Lymphs Abs: 1.7 10*3/uL (ref 0.7–4.0)
MCH: 26.9 pg (ref 26.0–34.0)
MCHC: 32.5 g/dL (ref 30.0–36.0)
MCV: 83 fL (ref 80.0–100.0)
Monocytes Absolute: 0.8 10*3/uL (ref 0.1–1.0)
Monocytes Relative: 6 %
Neutro Abs: 10.9 10*3/uL — ABNORMAL HIGH (ref 1.7–7.7)
Neutrophils Relative %: 81 %
Platelets: 467 10*3/uL — ABNORMAL HIGH (ref 150–400)
RBC: 4.12 MIL/uL (ref 3.87–5.11)
RDW: 13.2 % (ref 11.5–15.5)
WBC: 13.4 10*3/uL — ABNORMAL HIGH (ref 4.0–10.5)
nRBC: 0 % (ref 0.0–0.2)
nRBC: 0 /100 WBC

## 2020-10-02 LAB — COMPREHENSIVE METABOLIC PANEL
ALT: 166 U/L — ABNORMAL HIGH (ref 0–44)
AST: 77 U/L — ABNORMAL HIGH (ref 15–41)
Albumin: 2.5 g/dL — ABNORMAL LOW (ref 3.5–5.0)
Alkaline Phosphatase: 52 U/L (ref 38–126)
Anion gap: 8 (ref 5–15)
BUN: 11 mg/dL (ref 6–20)
CO2: 26 mmol/L (ref 22–32)
Calcium: 8.5 mg/dL — ABNORMAL LOW (ref 8.9–10.3)
Chloride: 101 mmol/L (ref 98–111)
Creatinine, Ser: 0.62 mg/dL (ref 0.44–1.00)
GFR, Estimated: >60 mL/min (ref >60)
Glucose, Bld: 164 mg/dL — ABNORMAL HIGH (ref 70–99)
Potassium: 4.6 mmol/L (ref 3.5–5.1)
Sodium: 135 mmol/L (ref 135–145)
Total Bilirubin: 0.6 mg/dL (ref 0.3–1.2)
Total Protein: 6.6 g/dL (ref 6.5–8.1)

## 2020-10-02 LAB — C-REACTIVE PROTEIN: CRP: 1.6 mg/dL — ABNORMAL HIGH (ref <1.0)

## 2020-10-02 LAB — D-DIMER, QUANTITATIVE: D-Dimer, Quant: 0.59 ug/mL-FEU — ABNORMAL HIGH (ref 0.00–0.50)

## 2020-10-02 NOTE — Progress Notes (Signed)
SATURATION QUALIFICATIONS: (This note is used to comply with regulatory documentation for home oxygen)  Patient Saturations on Room Air at Rest = 93%  Patient Saturations on Room Air while Ambulating = 87%  Patient Saturations on 1 Liters of oxygen while Ambulating = 92%  Please briefly explain why patient needs home oxygen: pt requiring o2 at this time

## 2020-10-02 NOTE — Progress Notes (Signed)
PROGRESS NOTE                                                                                                                                                                                                             Patient Demographics:    Lauren Delacruz, is a 29 y.o. female, DOB - Mar 01, 1991, FWY:[id number]  Outpatient [facility]   Admit date - 09/28/2020   LOS - 3  Chief Complaint  Patient presents with  . Covid Positive  . Respiratory Distress       Brief Narrative: Patient is a 29 y.o. female with no significant past medical history-presented with acute hypoxic respiratory failure due to COVID-19 pneumonia.  COVID-19 vaccinated status: Unvaccinated  Significant Events: 12/15>> Admit to [facility] for hypoxia due to COVID-19  Significant studies: 12/15>>Chest x-ray: Multifocal pneumonia  COVID-19 medications: Steroids: 12/15>> Remdesivir: 12/15>>  Antibiotics: None  Microbiology data: 12/15 >>blood culture: Pending  Procedures: None  Consults: None  DVT prophylaxis: enoxaparin (LOVENOX) injection 40 mg Start: 09/28/20 2145    Subjective:   Patient in bed, appears comfortable, denies any headache, no fever, no chest pain or pressure, no shortness of breath , no abdominal pain. No focal weakness.   Assessment  & Plan :   Acute Hypoxic Resp Failure due to Covid 19 Viral pneumonia: Mild hypoxemia-has significant coughing spells-continue antitussives.  Remains on steroids/REMDESIVIR.  Inflammatory markers downtrending-if in the unlikely event that she has worsening hypoxemia-she has consented to the use of Actemra/baricitinib.  Currently seems to be improving, down to room air at rest upon ambulation still requires some oxygen.  O2 requirements:  SpO2: 97 % O2 Flow Rate (L/min): 2 L/min   Prone/Incentive Spirometry: encouraged e incentive spirometry use  3-4/hour.   Recent Labs  Lab 09/28/20 2109 09/28/20 2214 09/28/20 2220 09/29/20 0447 09/29/20 0457 09/30/20 0217 10/01/20 0247 10/02/20 0455  WBC  --  7.6  --   --  6.3 6.8 11.5* 13.4*  HGB  --  12.5  --   --  11.4* 11.0* 10.8* 11.1*  HCT  --  39.1  --   --  35.6* 33.7* 33.6* 34.2*  PLT  --  311  --   --  292 359 427* 467*  CRP  --  10.3*  --   --  10.2* 7.7* 3.4* 1.6*  DDIMER  --  2.39*  --   --  1.84* 1.22* 0.71* 0.59*  PROCALCITON  --  0.14  --   --   --   --   --   --   AST  --  157*  --   --  137* 124* 88* 77*  ALT  --  92*  --   --  87* 110* 112* 166*  ALKPHOS  --  51  --   --  45 48 49 52  BILITOT  --  0.6  --   --  0.4 0.4 0.4 0.6  ALBUMIN  --  2.9*  --   --  2.6* 2.5* 2.5* 2.5*  LATICACIDVEN  --   --  1.3 0.8  --   --   --   --   SARSCOV2NAA POSITIVE*  --   --   --   --   --   --   --        Transaminitis: Secondary to COVID-19-mild-downtrending-follow periodically.  Hyponatremia: Resolved.  Obesity: Estimated body mass index is 31.26 kg/m as calculated from the following:   Height as of this encounter:  (1.626 m).   Weight as of this encounter: 82.6 kg.     GI prophylaxis: H2 Blocker  Condition - Stable  Family Communication  : Patient prefers to update family herself.  Code Status :  Full Code  Diet :  Diet Order            Diet regular Room service appropriate? Yes; Fluid consistency: Thin  Diet effective now                  Disposition Plan  :   Status is: Inpatient  Remains inpatient appropriate because:Inpatient level of care appropriate due to severity of illness   Dispo: The patient is from: Home              Anticipated d/c is to: Home              Anticipated d/c date is: 2 days              Patient currently is not medically stable to d/c.    Barriers to discharge: Hypoxia requiring O2 supplementation/complete 5 days of IV Remdesivir  Antimicorbials  :    Anti-infectives (From admission, onward)   Start      Dose/Rate Route Frequency Ordered Stop   09/29/20 1000  remdesivir 100 mg in sodium chloride 0.9 % 100 mL IVPB       "Followed by" Linked Group Details   100 mg 200 mL/hr over 30 Minutes Intravenous Daily 09/28/20 2023 10/03/20 0959   09/28/20 2030  remdesivir 200 mg in sodium chloride 0.9% 250 mL IVPB       "Followed by" Linked Group Details   200 mg 580 mL/hr over 30 Minutes Intravenous Once 09/28/20 2023 09/29/20 0014      Inpatient Medications  Scheduled Meds: . albuterol  2 puff Inhalation Q6H  . benzonatate  200 mg Oral TID  . enoxaparin (LOVENOX) injection  40 mg Subcutaneous Q24H  . famotidine  20 mg Oral Daily  . insulin aspart  0-9 Units Subcutaneous TID WC  . methylPREDNISolone (SOLU-MEDROL) injection  60 mg Intravenous Q12H  . ondansetron (ZOFRAN) IV  4 mg Intravenous Once  . sodium chloride flush  3 mL Intravenous Q12H   Continuous Infusions: . sodium chloride 1,000 mL (09/29/20 1341)  .  remdesivir 100 mg in NS 100 mL Stopped (10/01/20 1100)   PRN Meds:.acetaminophen, albuterol, chlorpheniramine-HYDROcodone, guaiFENesin-dextromethorphan, polyethylene glycol   Time Spent in minutes  25  See all Orders from today for further details   Susa Raring M.D on 10/02/2020 at 10:11 AM  To page go to [email or web address] - use universal password  [facility] -  Office  [phone]    Objective:   Vitals:   10/01/20 1423 10/01/20 2128 10/01/20 2145 10/02/20 0329  BP: 127/88 114/75    Pulse: 94 84 77 (!) 59  Resp: 20 20 20 18   Temp: 97.6 F (36.4 C) 98.4 F (36.9 C)    TempSrc: Oral Oral    SpO2: 97% 96% 95% 97%  Weight:    82.6 kg  Height:        Wt Readings from Last 3 Encounters:  10/02/20 82.6 kg  07/26/20 85.4 kg  05/10/20 88.7 kg     Intake/Output Summary (Last 24 hours) at 10/02/2020 1011 Last data filed at 10/01/2020 1300 Gross per 24 hour  Intake 240 ml  Output --  Net 240 ml     Physical Exam  Awake Alert, No new F.N deficits,  Normal affect Hailey.AT,PERRAL Supple Neck,No JVD, No cervical lymphadenopathy appriciated.  Symmetrical Chest wall movement, Good air movement bilaterally, CTAB RRR,No Gallops, Rubs or new Murmurs, No Parasternal Heave +ve B.Sounds, Abd Soft, No tenderness, No organomegaly appriciated, No rebound - guarding or rigidity. No Cyanosis, Clubbing or edema, No new Rash or bruise    Data Review:    CBC Recent Labs  Lab 09/28/20 2214 09/29/20 0457 09/30/20 0217 10/01/20 0247 10/02/20 0455  WBC 7.6 6.3 6.8 11.5* 13.4*  HGB 12.5 11.4* 11.0* 10.8* 11.1*  HCT 39.1 35.6* 33.7* 33.6* 34.2*  PLT 311 292 359 427* 467*  MCV 82.7 81.8 81.2 82.2 83.0  MCH 26.4 26.2 26.5 26.4 26.9  MCHC 32.0 32.0 32.6 32.1 32.5  RDW 13.2 13.2 13.1 13.2 13.2  LYMPHSABS 1.2 0.6* 1.1 1.5 1.7  MONOABS 0.1 0.1 0.2 0.4 0.8  EOSABS 0.0 0.0 0.0 0.0 0.0  BASOSABS 0.0 0.0 0.0 0.0 0.0    Chemistries  Recent Labs  Lab 09/28/20 2214 09/29/20 0457 09/30/20 0217 10/01/20 0247 10/02/20 0455  NA 130* 134* 138 137 135  K 3.7 4.5 4.0 4.0 4.6  CL 91* 96* 99 101 101  CO2 27 28 28 26 26   GLUCOSE 102* 149* 156* 176* 164*  BUN 5* 5* 10 12 11   CREATININE 0.79 0.87 0.63 0.64 0.62  CALCIUM 8.3* 8.4* 8.8* 8.6* 8.5*  MG  --  2.1  --   --   --   AST 157* 137* 124* 88* 77*  ALT 92* 87* 110* 112* 166*  ALKPHOS 51 45 48 49 52  BILITOT 0.6 0.4 0.4 0.4 0.6   ------------------------------------------------------------------------------------------------------------------ No results for input(s): CHOL, HDL, LDLCALC, TRIG, CHOLHDL, LDLDIRECT in the last 72 hours.  Lab Results  Component Value Date   HGBA1C 6.0 (H) 09/30/2020   ------------------------------------------------------------------------------------------------------------------ No results for input(s): TSH, T4TOTAL, T3FREE, THYROIDAB in the last 72 hours.  Invalid input(s):  FREET3 ------------------------------------------------------------------------------------------------------------------ Recent Labs    09/30/20 0217 10/01/20 0247  FERRITIN 268 248    Coagulation profile No results for input(s): INR, PROTIME in the last 168 hours.  Recent Labs    10/01/20 0247 10/02/20 0455  DDIMER 0.71* 0.59*    Cardiac Enzymes No results for input(s): CKMB, TROPONINI, MYOGLOBIN in the last 168 hours.  Invalid input(s):  CK ------------------------------------------------------------------------------------------------------------------ No results found for: BNP  Micro Results Recent Results (from the past 240 hour(s))  Resp Panel by RT-PCR (Flu A&B, Covid) Nasopharyngeal Swab     Status: Abnormal   Collection Time: 09/28/20  9:09 PM   Specimen: Nasopharyngeal Swab; Nasopharyngeal(NP) swabs in vial transport medium  Result Value Ref Range Status   SARS Coronavirus 2 by RT PCR POSITIVE (A) NEGATIVE Final    Comment: RESULT CALLED TO, READ BACK BY AND VERIFIED WITH: L VENEGAS RN 09/28/20 2335 JDW (NOTE) SARS-CoV-2 target nucleic acids are DETECTED.  The SARS-CoV-2 RNA is generally detectable in upper respiratory specimens during the acute phase of infection. Positive results are indicative of the presence of the identified virus, but do not rule out bacterial infection or co-infection with other pathogens not detected by the test. Clinical correlation with patient history and other diagnostic information is necessary to determine patient infection status. The expected result is Negative.  Fact Sheet for Patients: BloggerCourse.comhttps://www.fda.gov/media/152166/download  Fact Sheet for Healthcare Providers: SeriousBroker.ithttps://www.fda.gov/media/152162/download  This test is not yet approved or cleared by the [place] FDA and  has been authorized for detection and/or diagnosis of SARS-CoV-2 by FDA under an Emergency Use Authorization (EUA).  This EUA will remain in  effect (meaning this test can be us ed) for the duration of  the COVID-19 declaration under Section 564(b)(1) of the Act, 21 U.S.C. section 360bbb-3(b)(1), unless the authorization is terminated or revoked sooner.     Influenza A by PCR NEGATIVE NEGATIVE Final   Influenza B by PCR NEGATIVE NEGATIVE Final    Comment: (NOTE) The Xpert Xpress SARS-CoV-2/FLU/RSV plus assay is intended as an aid in the diagnosis of influenza from Nasopharyngeal swab specimens and should not be used as a sole basis for treatment. Nasal washings and aspirates are unacceptable for Xpert Xpress SARS-CoV-2/FLU/RSV testing.  Fact Sheet for Patients: BloggerCourse.comhttps://www.fda.gov/media/152166/download  Fact Sheet for Healthcare Providers: SeriousBroker.ithttps://www.fda.gov/media/152162/download  This test is not yet approved or cleared by the [place] FDA and has been authorized for detection and/or diagnosis of SARS-CoV-2 by FDA under an Emergency Use Authorization (EUA). This EUA will remain in effect (meaning this test can be used) for the duration of the COVID-19 declaration under Section 564(b)(1) of the Act, 21 U.S.C. section 360bbb-3(b)(1), unless the authorization is terminated or revoked.  Performed at [facility], [address] [place], [place] [address]   Blood Culture (routine x 2)     Status: None (Preliminary result)   Collection Time: 09/28/20  9:10 PM   Specimen: BLOOD  Result Value Ref Range Status   Specimen Description BLOOD LEFT ANTECUBITAL  Final   Special Requests   Final    BOTTLES DRAWN AEROBIC AND ANAEROBIC Blood Culture adequate volume   Culture   Final    NO GROWTH 3 DAYS Performed at [facility], [address] [place], [place] [address]    Report Status PENDING  Incomplete  Blood Culture (routine x 2)     Status: None (Preliminary result)   Collection Time: 09/28/20 10:08 PM   Specimen: BLOOD  Result Value Ref Range Status   Specimen Description BLOOD RIGHT ANTECUBITAL   Final   Special Requests   Final    BOTTLES DRAWN AEROBIC AND ANAEROBIC Blood Culture adequate volume   Culture   Final    NO GROWTH 3 DAYS Performed at [facility], [address] [place], [place] [address]    Report Status PENDING  Incomplete  Radiology Reports CT Angio Chest PE W and/or Wo Contrast  Result Date: 09/28/2020 CLINICAL DATA:  COVID cough difficulty breathing EXAM: CT ANGIOGRAPHY CHEST WITH CONTRAST TECHNIQUE: Multidetector CT imaging of the chest was performed using the standard protocol during bolus administration of intravenous contrast. Multiplanar CT image reconstructions and MIPs were obtained to evaluate the vascular anatomy. CONTRAST:  60 mL OMNIPAQUE IOHEXOL 350 MG/ML SOLN COMPARISON:  Chest x-ray 09/28/2020 FINDINGS: Cardiovascular: Satisfactory opacification of the pulmonary arteries to the segmental level. No evidence of pulmonary embolism. Nonaneurysmal aorta. Normal cardiac size. No pericardial effusion. Mediastinum/Nodes: No enlarged mediastinal, hilar, or axillary lymph nodes. Thyroid gland, trachea, and esophagus demonstrate no significant findings. Lungs/Pleura: Fairly extensive bilateral ground-glass densities and consolidations within the right greater than left lungs. No pleural effusion or pneumothorax. Upper Abdomen: No acute abnormality. Musculoskeletal: No chest wall abnormality. No acute or significant osseous findings. Review of the MIP images confirms the above findings. IMPRESSION: 1. Negative for acute pulmonary embolus. 2. Fairly extensive bilateral ground-glass densities and consolidations within the right greater than left lungs, consistent with bilateral pneumonia and history of COVID positivity. Electronically Signed   By: Jasmine Pang M.D.   On: 09/28/2020 21:30   DG Chest Portable 1 View  Result Date: 09/28/2020 CLINICAL DATA:  COVID positive. Chest congestion. Productive cough. EXAM: PORTABLE CHEST 1 VIEW COMPARISON:  None. FINDINGS:  Heart size is normal. Lung volumes are low. Patchy bilateral airspace opacities are present in the right middle lobe and left lower lobe predominantly. Left effusion is present. Axial skeleton is unremarkable. IMPRESSION: 1. Patchy bilateral airspace disease compatible with multifocal pneumonia. 2. Left pleural effusion. 3. Low lung volumes. Electronically Signed   By: Marin Roberts M.D.   On: 09/28/2020 15:38

## 2020-10-02 NOTE — TOC Progression Note (Signed)
Transition of Care [facility]) - Progression Note    Patient Details  Name: Lauren Delacruz MRN: [id number] Date of Birth: 14-Jun-1991  Transition of Care [facility]) CM/SW Contact  Kermit Balo, RN Phone Number: 10/02/2020, 2:22 PM  Clinical Narrative:    Pt with orders for home oxygen and qualifying sats. CM spoke with the patient and she has no preference for the DME company supplying the oxygen.  CM made referral to [employer]. Oxygen will be delivered to the room for transport and scheduled also for home delivery. TOC following for further d/c needs.    Expected Discharge Plan: Home/Self Care Barriers to Discharge: Continued Medical Work up  Expected Discharge Plan and Services Expected Discharge Plan: Home/Self Care   Discharge Planning Services: CM Consult Post Acute Care Choice: Durable Medical Equipment Living arrangements for the past 2 months: Single Family Home                 DME Arranged: Oxygen DME Agency:  [employer]) Date DME Agency Contacted: 10/02/20   Representative spoke with at DME Agency: Vaughan Basta             Social Determinants of Health (SDOH) Interventions    Readmission Risk Interventions No flowsheet data found.

## 2020-10-03 DIAGNOSIS — J1282 Pneumonia due to coronavirus disease 2019: Secondary | ICD-10-CM | POA: Diagnosis not present

## 2020-10-03 DIAGNOSIS — J9601 Acute respiratory failure with hypoxia: Secondary | ICD-10-CM | POA: Diagnosis not present

## 2020-10-03 DIAGNOSIS — U071 COVID-19: Secondary | ICD-10-CM | POA: Diagnosis not present

## 2020-10-03 LAB — CBC WITH DIFFERENTIAL/PLATELET
Abs Immature Granulocytes: 1.13 10*3/uL — ABNORMAL HIGH (ref 0.00–0.07)
Basophils Absolute: 0.1 10*3/uL (ref 0.0–0.1)
Basophils Relative: 0 %
Eosinophils Absolute: 0 10*3/uL (ref 0.0–0.5)
Eosinophils Relative: 0 %
HCT: 37.7 % (ref 36.0–46.0)
Hemoglobin: 12 g/dL (ref 12.0–15.0)
Immature Granulocytes: 7 %
Lymphocytes Relative: 14 %
Lymphs Abs: 2.5 10*3/uL (ref 0.7–4.0)
MCH: 26.6 pg (ref 26.0–34.0)
MCHC: 31.8 g/dL (ref 30.0–36.0)
MCV: 83.6 fL (ref 80.0–100.0)
Monocytes Absolute: 0.6 10*3/uL (ref 0.1–1.0)
Monocytes Relative: 4 %
Neutro Abs: 13 10*3/uL — ABNORMAL HIGH (ref 1.7–7.7)
Neutrophils Relative %: 75 %
Platelets: 571 10*3/uL — ABNORMAL HIGH (ref 150–400)
RBC: 4.51 MIL/uL (ref 3.87–5.11)
RDW: 13.3 % (ref 11.5–15.5)
WBC: 17.3 10*3/uL — ABNORMAL HIGH (ref 4.0–10.5)
nRBC: 0.1 % (ref 0.0–0.2)

## 2020-10-03 LAB — COMPREHENSIVE METABOLIC PANEL
ALT: 132 U/L — ABNORMAL HIGH (ref 0–44)
AST: 39 U/L (ref 15–41)
Albumin: 2.6 g/dL — ABNORMAL LOW (ref 3.5–5.0)
Alkaline Phosphatase: 68 U/L (ref 38–126)
Anion gap: 10 (ref 5–15)
BUN: 13 mg/dL (ref 6–20)
CO2: 25 mmol/L (ref 22–32)
Calcium: 8.8 mg/dL — ABNORMAL LOW (ref 8.9–10.3)
Chloride: 101 mmol/L (ref 98–111)
Creatinine, Ser: 0.66 mg/dL (ref 0.44–1.00)
GFR, Estimated: >60 mL/min (ref >60)
Glucose, Bld: 193 mg/dL — ABNORMAL HIGH (ref 70–99)
Potassium: 4.6 mmol/L (ref 3.5–5.1)
Sodium: 136 mmol/L (ref 135–145)
Total Bilirubin: 0.2 mg/dL — ABNORMAL LOW (ref 0.3–1.2)
Total Protein: 6.7 g/dL (ref 6.5–8.1)

## 2020-10-03 LAB — D-DIMER, QUANTITATIVE: D-Dimer, Quant: 0.49 ug/mL-FEU (ref 0.00–0.50)

## 2020-10-03 LAB — CULTURE, BLOOD (ROUTINE X 2)
Culture: NO GROWTH
Culture: NO GROWTH
Special Requests: ADEQUATE
Special Requests: ADEQUATE

## 2020-10-03 LAB — C-REACTIVE PROTEIN: CRP: 1.1 mg/dL — ABNORMAL HIGH (ref <1.0)

## 2020-10-03 LAB — GLUCOSE, CAPILLARY: Glucose-Capillary: 122 mg/dL — ABNORMAL HIGH (ref 70–99)

## 2020-10-03 MED ORDER — ALBUTEROL SULFATE HFA 108 (90 BASE) MCG/ACT IN AERS: 2.0000 | INHALATION_SPRAY | Freq: Four times a day (QID) | RESPIRATORY_TRACT | 0 refills | Status: DC

## 2020-10-03 MED ORDER — GUAIFENESIN-DM 100-10 MG/5ML PO SYRP: 10.0000 mL | ORAL_SOLUTION | Freq: Three times a day (TID) | ORAL | 0 refills | Status: DC | PRN

## 2020-10-03 NOTE — TOC Transition Note (Signed)
Transition of Care [facility]) - CM/SW Discharge Note   Patient Details  Name: Lauren Delacruz MRN: [id number] Date of Birth: 17-Feb-1991  Transition of Care [facility]) CM/SW Contact:  Kermit Balo, RN Phone Number: 10/03/2020, 11:52 AM   Clinical Narrative:    CM verified with [employer] that oxygen had been delivered to the hospital.  Pt has transport home.    Final next level of care: Home/Self Care Barriers to Discharge: No Barriers Identified   Patient Goals and CMS Choice     Choice offered to / list presented to : Patient  Discharge Placement                       Discharge Plan and Services   Discharge Planning Services: CM Consult Post Acute Care Choice: Durable Medical Equipment          DME Arranged: Oxygen DME Agency:  [employer] [facility]) Date DME Agency Contacted: 10/02/20   Representative spoke with at DME Agency: Vaughan Basta            Social Determinants of Health (SDOH) Interventions     Readmission Risk Interventions No flowsheet data found.

## 2020-10-03 NOTE — Discharge Summary (Signed)
Lauren Delacruz ZOX:[id number] DOB: 12-10-1990 DOA: 09/28/2020  [facility]P: [facility]  Admit date: 09/28/2020  Discharge date: 10/03/2020  Admitted From: Home  Disposition:  Home   Recommendations for Outpatient Follow-up:   Follow up with PCP in 1-2 weeks  PCP Please obtain BMP/CBC, 2 view CXR in 1week,  (see Discharge instructions)   PCP Please follow up on the following pending results: Check CBC, CMP and a two-view chest x-ray in 7 to 10 days   Home Health: None   Equipment/Devices: 2lit o2 PRN  Consultations: None  Discharge Condition: Stable    CODE STATUS: Full   Diet Recommendation: Heart Healthy   Diet Order            Diet - low sodium heart healthy           Diet regular Room service appropriate? Yes; Fluid consistency: Thin  Diet effective now                  Chief Complaint  Patient presents with  . Covid Positive  . Respiratory Distress     Brief history of present illness from the day of admission and additional interim summary    Patient is a 29 y.o. female with no significant past medical history-presented with acute hypoxic respiratory failure due to COVID-19 pneumonia.  COVID-19 vaccinated status: Unvaccinated  Significant Events: 12/15>> Admit to [facility] for hypoxia due to COVID-19  Significant studies: 12/15>>Chest x-ray: Multifocal pneumonia  COVID-19 medications: Steroids: 12/15>> Remdesivir: 12/15>>  Antibiotics: None  Microbiology data: 12/15 >>blood culture: Pending                                                                 Hospital Course    Acute Hypoxic Resp Failure due to Covid 19 Viral pneumonia: Mild hypoxemia-has significant coughing spells-continue antitussives.  Remains on steroids/REMDESIVIR.  Inflammatory markers downtrending.  She  is much better and on room air symptom-free at rest.  Upon ambulation she requires 1 to 2 L of oxygen.  She is finished her COVID-19 specific treatment will be discharged home with a rescue inhaler, as needed 2 L home oxygen with PCP follow-up in a week.  Recent Labs  Lab 09/28/20 1519 09/28/20 2109 09/28/20 2214 09/28/20 2220 09/29/20 0447 09/29/20 0457 09/30/20 0217 10/01/20 0247 10/02/20 0455 10/03/20 0501  WBC  --   --  7.6  --   --  6.3 6.8 11.5* 13.4* 17.3*  CRP   < >  --  10.3*  --   --  10.2* 7.7* 3.4* 1.6* 1.1*  DDIMER   < >  --  2.39*  --   --  1.84* 1.22* 0.71* 0.59* 0.49  PROCALCITON  --   --  0.14  --   --   --   --   --   --   --  LATICACIDVEN  --   --   --  1.3 0.8  --   --   --   --   --   AST   < >  --  157*  --   --  137* 124* 88* 77* 39  ALT   < >  --  92*  --   --  87* 110* 112* 166* 132*  ALKPHOS   < >  --  51  --   --  45 48 49 52 68  BILITOT   < >  --  0.6  --   --  0.4 0.4 0.4 0.6 0.2*  ALBUMIN   < >  --  2.9*  --   --  2.6* 2.5* 2.5* 2.5* 2.6*  SARSCOV2NAA  --  POSITIVE*  --   --   --   --   --   --   --   --    < > = values in this interval not displayed.      Mild downtrending transaminitis.  Asymptomatic due to Covid infection.  PCP to repeat CMP in 7 to 10 days.   Discharge diagnosis     Principal Problem:   Pneumonia due to COVID-19 virus Active Problems:   Acute respiratory failure with hypoxia (HCC)   Hyponatremia    Discharge instructions    Discharge Instructions    Diet - low sodium heart healthy   Complete by: As directed    Discharge instructions   Complete by: As directed    Follow with Primary MD [facility] in 7 days   Get CBC, CMP, 2 view Chest X ray -  checked next visit within 1 week by Primary MD    Activity: As tolerated with Full fall precautions use walker/cane & assistance as needed  Disposition Home    Diet: Heart Healthy   Special Instructions: If you have smoked or chewed Tobacco  in the last 2  yrs please stop smoking, stop any regular Alcohol  and or any Recreational drug use.  On your next visit with your primary care physician please Get Medicines reviewed and adjusted.  Please request your Prim.MD to go over all Hospital Tests and Procedure/Radiological results at the follow up, please get all Hospital records sent to your Prim MD by signing hospital release before you go home.  If you experience worsening of your admission symptoms, develop shortness of breath, life threatening emergency, suicidal or homicidal thoughts you must seek medical attention immediately by calling 911 or calling your MD immediately  if symptoms less severe.  You Must read complete instructions/literature along with all the possible adverse reactions/side effects for all the Medicines you take and that have been prescribed to you. Take any new Medicines after you have completely understood and accpet all the possible adverse reactions/side effects.   Increase activity slowly   Complete by: As directed    MyChart COVID-19 home monitoring program   Complete by: Oct 03, 2020    Is the patient willing to use the MyChart Mobile App for home monitoring?: Yes   Temperature monitoring   Complete by: Oct 03, 2020    After how many days would you like to receive a notification of this patient's flowsheet entries?: 1      Discharge Medications   Allergies as of 10/03/2020      Reactions   Penicillins Other (See Comments)   Mother told her she was allergic, has taken Amoxicillin w/o  problem      Medication List    TAKE these medications   albuterol 108 (90 Base) MCG/ACT inhaler Commonly known as: VENTOLIN HFA Inhale 2 puffs into the lungs every 6 (six) hours.   guaiFENesin-dextromethorphan 100-10 MG/5ML syrup Commonly known as: ROBITUSSIN DM Take 10 mLs by mouth every 8 (eight) hours as needed for cough.   Mucinex Fast-Max Cold/Flu 5-10-200-325 MG/10ML Liqd Generic drug:  Phenylephrine-DM-GG-APAP Take 1 Dose by mouth as needed (cold and flu).            Durable Medical Equipment  (From admission, onward)         Start     Ordered   10/02/20 1014  For home use only DME oxygen  Once       Question Answer Comment  Length of Need 6 Months   Mode or (Route) Nasal cannula   Liters per Minute 2   Frequency Continuous (stationary and portable oxygen unit needed)   Oxygen conserving device Yes   Oxygen delivery system Gas      10/02/20 1013           Follow-up Information    [facility] Follow up on 10/13/2020.   Why: 2:00 pm. Please keep this appointment, if you need to change this appointment for any reason please call.  Contact information: [address] [place] [phone]       [facility] Schedule an appointment as soon as possible for a visit in 1 week(s).   Specialty: Obstetrics and Gynecology Contact information: [address] [place] [id number] [phone]               Major procedures and Radiology Reports - PLEASE review detailed and final reports thoroughly  -       CT Angio Chest PE W and/or Wo Contrast  Result Date: 09/28/2020 CLINICAL DATA:  COVID cough difficulty breathing EXAM: CT ANGIOGRAPHY CHEST WITH CONTRAST TECHNIQUE: Multidetector CT imaging of the chest was performed using the standard protocol during bolus administration of intravenous contrast. Multiplanar CT image reconstructions and MIPs were obtained to evaluate the vascular anatomy. CONTRAST:  60 mL OMNIPAQUE IOHEXOL 350 MG/ML SOLN COMPARISON:  Chest x-ray 09/28/2020 FINDINGS: Cardiovascular: Satisfactory opacification of the pulmonary arteries to the segmental level. No evidence of pulmonary embolism. Nonaneurysmal aorta. Normal cardiac size. No pericardial effusion. Mediastinum/Nodes: No enlarged mediastinal, hilar, or axillary lymph nodes. Thyroid gland, trachea, and esophagus  demonstrate no significant findings. Lungs/Pleura: Fairly extensive bilateral ground-glass densities and consolidations within the right greater than left lungs. No pleural effusion or pneumothorax. Upper Abdomen: No acute abnormality. Musculoskeletal: No chest wall abnormality. No acute or significant osseous findings. Review of the MIP images confirms the above findings. IMPRESSION: 1. Negative for acute pulmonary embolus. 2. Fairly extensive bilateral ground-glass densities and consolidations within the right greater than left lungs, consistent with bilateral pneumonia and history of COVID positivity. Electronically Signed   By: Jasmine PangKim  Fujinaga M.D.   On: 09/28/2020 21:30   DG Chest Portable 1 View  Result Date: 09/28/2020 CLINICAL DATA:  COVID positive. Chest congestion. Productive cough. EXAM: PORTABLE CHEST 1 VIEW COMPARISON:  None. FINDINGS: Heart size is normal. Lung volumes are low. Patchy bilateral airspace opacities are present in the right middle lobe and left lower lobe predominantly. Left effusion is present. Axial skeleton is unremarkable. IMPRESSION: 1. Patchy bilateral airspace disease compatible with multifocal pneumonia. 2. Left pleural effusion. 3. Low lung volumes. Electronically  Signed   By: Marin Roberts M.D.   On: 09/28/2020 15:38    Micro Results     Recent Results (from the past 240 hour(s))  Resp Panel by RT-PCR (Flu A&B, Covid) Nasopharyngeal Swab     Status: Abnormal   Collection Time: 09/28/20  9:09 PM   Specimen: Nasopharyngeal Swab; Nasopharyngeal(NP) swabs in vial transport medium  Result Value Ref Range Status   SARS Coronavirus 2 by RT PCR POSITIVE (A) NEGATIVE Final    Comment: RESULT CALLED TO, READ BACK BY AND VERIFIED WITH: L VENEGAS RN 09/28/20 2335 JDW (NOTE) SARS-CoV-2 target nucleic acids are DETECTED.  The SARS-CoV-2 RNA is generally detectable in upper respiratory specimens during the acute phase of infection. Positive results are indicative  of the presence of the identified virus, but do not rule out bacterial infection or co-infection with other pathogens not detected by the test. Clinical correlation with patient history and other diagnostic information is necessary to determine patient infection status. The expected result is Negative.  Fact Sheet for Patients: BloggerCourse.com  Fact Sheet for Healthcare Providers: SeriousBroker.it  This test is not yet approved or cleared by the [place] FDA and  has been authorized for detection and/or diagnosis of SARS-CoV-2 by FDA under an Emergency Use Authorization (EUA).  This EUA will remain in effect (meaning this test can be [place] ed) for the duration of  the COVID-19 declaration under Section 564(b)(1) of the Act, 21 U.S.C. section 360bbb-3(b)(1), unless the authorization is terminated or revoked sooner.     Influenza A by PCR NEGATIVE NEGATIVE Final   Influenza B by PCR NEGATIVE NEGATIVE Final    Comment: (NOTE) The Xpert Xpress SARS-CoV-2/FLU/RSV plus assay is intended as an aid in the diagnosis of influenza from Nasopharyngeal swab specimens and should not be used as a sole basis for treatment. Nasal washings and aspirates are unacceptable for Xpert Xpress SARS-CoV-2/FLU/RSV testing.  Fact Sheet for Patients: BloggerCourse.com  Fact Sheet for Healthcare Providers: SeriousBroker.it  This test is not yet approved or cleared by the [place] FDA and has been authorized for detection and/or diagnosis of SARS-CoV-2 by FDA under an Emergency Use Authorization (EUA). This EUA will remain in effect (meaning this test can be used) for the duration of the COVID-19 declaration under Section 564(b)(1) of the Act, 21 U.S.C. section 360bbb-3(b)(1), unless the authorization is terminated or revoked.  Performed at [facility], [address] [place],  [place] [address]   Blood Culture (routine x 2)     Status: None   Collection Time: 09/28/20  9:10 PM   Specimen: BLOOD  Result Value Ref Range Status   Specimen Description BLOOD LEFT ANTECUBITAL  Final   Special Requests   Final    BOTTLES DRAWN AEROBIC AND ANAEROBIC Blood Culture adequate volume   Culture   Final    NO GROWTH 5 DAYS Performed at [facility], [address], [place], [place] [address]    Report Status 10/03/2020 FINAL  Final  Blood Culture (routine x 2)     Status: None   Collection Time: 09/28/20 10:08 PM   Specimen: BLOOD  Result Value Ref Range Status   Specimen Description BLOOD RIGHT ANTECUBITAL  Final   Special Requests   Final    BOTTLES DRAWN AEROBIC AND ANAEROBIC Blood Culture adequate volume   Culture   Final    NO GROWTH 5 DAYS Performed at [facility], [address] [place], [place]  [address]    Report Status 10/03/2020 FINAL  Final    Today   Subjective    Lauren Delacruz today has no headache,no chest abdominal pain,no new weakness tingling or numbness, feels much better wants to go home today.     Objective   Blood pressure 135/89, pulse 66, temperature 98 F (36.7 C), temperature source Oral, resp. rate 18, height 5\' 4"  (1.626 m), weight 82.6 kg, SpO2 94 %, unknown if currently breastfeeding.  No intake or output data in the 24 hours ending 10/03/20 1039  Exam  Awake Alert, No new F.N deficits, Normal affect [place].AT,PERRAL Supple Neck,No JVD, No cervical lymphadenopathy appriciated.  Symmetrical Chest wall movement, Good air movement bilaterally, CTAB RRR,No Gallops,Rubs or new Murmurs, No Parasternal Heave +ve B.Sounds, Abd Soft, Non tender, No organomegaly appriciated, No rebound -guarding or rigidity. No Cyanosis, Clubbing or edema, No new Rash or bruise   Data Review   CBC w Diff:  Lab Results  Component Value Date   WBC 17.3 (H) 10/03/2020   HGB 12.0 10/03/2020   HGB 10.6 (L) 01/29/2020   HCT 37.7 10/03/2020   HCT  31.5 (L) 01/29/2020   PLT 571 (H) 10/03/2020   PLT 315 01/29/2020   LYMPHOPCT 14 10/03/2020   MONOPCT 4 10/03/2020   EOSPCT 0 10/03/2020   BASOPCT 0 10/03/2020    CMP:  Lab Results  Component Value Date   NA 136 10/03/2020   K 4.6 10/03/2020   CL 101 10/03/2020   CO2 25 10/03/2020   BUN 13 10/03/2020   CREATININE 0.66 10/03/2020   PROT 6.7 10/03/2020   ALBUMIN 2.6 (L) 10/03/2020   BILITOT 0.2 (L) 10/03/2020   ALKPHOS 68 10/03/2020   AST 39 10/03/2020   ALT 132 (H) 10/03/2020  .   Total Time in preparing paper work, data evaluation and todays exam - 35 minutes  10/05/2020 M.D on 10/03/2020 at 10:39 AM  [facility]

## 2020-10-03 NOTE — Discharge Instructions (Signed)
Follow with Primary MD [facility] in 7 days   Get CBC, CMP, 2 view Chest X ray -  checked next visit within 1 week by Primary MD    Activity: As tolerated with Full fall precautions use walker/cane & assistance as needed  Disposition Home    Diet: Heart Healthy   Special Instructions: If you have smoked or chewed Tobacco  in the last 2 yrs please stop smoking, stop any regular Alcohol  and or any Recreational drug use.  On your next visit with your primary care physician please Get Medicines reviewed and adjusted.  Please request your Prim.MD to go over all Hospital Tests and Procedure/Radiological results at the follow up, please get all Hospital records sent to your Prim MD by signing hospital release before you go home.  If you experience worsening of your admission symptoms, develop shortness of breath, life threatening emergency, suicidal or homicidal thoughts you must seek medical attention immediately by calling 911 or calling your MD immediately  if symptoms less severe.  You Must read complete instructions/literature along with all the possible adverse reactions/side effects for all the Medicines you take and that have been prescribed to you. Take any new Medicines after you have completely understood and accpet all the possible adverse reactions/side effects.          Person Under Monitoring Name: Lauren Delacruz  Location: [address] [place] [address]   Infection Prevention Recommendations for Individuals Confirmed to have, or Being Evaluated for, 2019 Novel Coronavirus (COVID-19) Infection Who Receive Care at Home  Individuals who are confirmed to have, or are being evaluated for, COVID-19 should follow the prevention steps below until a healthcare provider or local or state health department says they can return to normal activities.  Stay home except to get medical care You should restrict activities outside your home, except for getting  medical care. Do not go to work, school, or public areas, and do not use public transportation or taxis.  Call ahead before visiting your doctor Before your medical appointment, call the healthcare provider and tell them that you have, or are being evaluated for, COVID-19 infection. This will help the healthcare provider's office take steps to keep other people from getting infected. Ask your healthcare provider to call the local or state health department.  Monitor your symptoms Seek prompt medical attention if your illness is worsening (e.g., difficulty breathing). Before going to your medical appointment, call the healthcare provider and tell them that you have, or are being evaluated for, COVID-19 infection. Ask your healthcare provider to call the local or state health department.  Wear a facemask You should wear a facemask that covers your nose and mouth when you are in the same room with other people and when you visit a healthcare provider. People who live with or visit you should also wear a facemask while they are in the same room with you.  Separate yourself from other people in your home As much as possible, you should stay in a different room from other people in your home. Also, you should use a separate bathroom, if available.  Avoid sharing household items You should not share dishes, drinking glasses, cups, eating utensils, towels, bedding, or other items with other people in your home. After using these items, you should wash them thoroughly with soap and water.  Cover your coughs and sneezes Cover your mouth and nose with a tissue when you cough or sneeze, or you can  cough or sneeze into your sleeve. Throw used tissues in a lined trash can, and immediately wash your hands with soap and water for at least 20 seconds or use an alcohol-based hand rub.  Wash your [facility] your hands often and thoroughly with soap and water for at least 20 seconds. You can use an  alcohol-based hand sanitizer if soap and water are not available and if your hands are not visibly dirty. Avoid touching your eyes, nose, and mouth with unwashed hands.   Prevention Steps for Caregivers and Household Members of Individuals Confirmed to have, or Being Evaluated for, COVID-19 Infection Being Cared for in the Home  If you live with, or provide care at home for, a person confirmed to have, or being evaluated for, COVID-19 infection please follow these guidelines to prevent infection:  Follow healthcare provider's instructions Make sure that you understand and can help the patient follow any healthcare provider instructions for all care.  Provide for the patient's basic needs You should help the patient with basic needs in the home and provide support for getting groceries, prescriptions, and other personal needs.  Monitor the patient's symptoms If they are getting sicker, call his or her medical provider and tell them that the patient has, or is being evaluated for, COVID-19 infection. This will help the healthcare provider's office take steps to keep other people from getting infected. Ask the healthcare provider to call the local or state health department.  Limit the number of people who have contact with the patient  If possible, have only one caregiver for the patient.  Other household members should stay in another home or place of residence. If this is not possible, they should stay  in another room, or be separated from the patient as much as possible. Use a separate bathroom, if available.  Restrict visitors who do not have an essential need to be in the home.  Keep older adults, very young children, and other sick people away from the patient Keep older adults, very young children, and those who have compromised immune systems or chronic health conditions away from the patient. This includes people with chronic heart, lung, or kidney conditions, diabetes, and  cancer.  Ensure good ventilation Make sure that shared spaces in the home have good air flow, such as from an air conditioner or an opened window, weather permitting.  Wash your hands often  Wash your hands often and thoroughly with soap and water for at least 20 seconds. You can use an alcohol based hand sanitizer if soap and water are not available and if your hands are not visibly dirty.  Avoid touching your eyes, nose, and mouth with unwashed hands.  Use disposable paper towels to dry your hands. If not available, use dedicated cloth towels and replace them when they become wet.  Wear a facemask and gloves  Wear a disposable facemask at all times in the room and gloves when you touch or have contact with the patient's blood, body fluids, and/or secretions or excretions, such as sweat, saliva, sputum, nasal mucus, vomit, urine, or feces.  Ensure the mask fits over your nose and mouth tightly, and do not touch it during use.  Throw out disposable facemasks and gloves after using them. Do not reuse.  Wash your hands immediately after removing your facemask and gloves.  If your personal clothing becomes contaminated, carefully remove clothing and launder. Wash your hands after handling contaminated clothing.  Place all used disposable facemasks, gloves, and  other waste in a lined container before disposing them with other household waste.  Remove gloves and wash your hands immediately after handling these items.  Do not share dishes, glasses, or other household items with the patient  Avoid sharing household items. You should not share dishes, drinking glasses, cups, eating utensils, towels, bedding, or other items with a patient who is confirmed to have, or being evaluated for, COVID-19 infection.  After the person uses these items, you should wash them thoroughly with soap and water.  Wash laundry thoroughly  Immediately remove and wash clothes or bedding that have blood, body  fluids, and/or secretions or excretions, such as sweat, saliva, sputum, nasal mucus, vomit, urine, or feces, on them.  Wear gloves when handling laundry from the patient.  Read and follow directions on labels of laundry or clothing items and detergent. In general, wash and dry with the warmest temperatures recommended on the label.  Clean all areas the individual has used often  Clean all touchable surfaces, such as counters, tabletops, doorknobs, bathroom fixtures, toilets, phones, keyboards, tablets, and bedside tables, every day. Also, clean any surfaces that may have blood, body fluids, and/or secretions or excretions on them.  Wear gloves when cleaning surfaces the patient has come in contact with.  Use a diluted bleach solution (e.g., dilute bleach with 1 part bleach and 10 parts water) or a household disinfectant with a label that says EPA-registered for coronaviruses. To make a bleach solution at home, add 1 tablespoon of bleach to 1 quart (4 cups) of water. For a larger supply, add  cup of bleach to 1 gallon (16 cups) of water.  Read labels of cleaning products and follow recommendations provided on product labels. Labels contain instructions for safe and effective use of the cleaning product including precautions you should take when applying the product, such as wearing gloves or eye protection and making sure you have good ventilation during use of the product.  Remove gloves and wash hands immediately after cleaning.  Monitor yourself for signs and symptoms of illness Caregivers and household members are considered close contacts, should monitor their health, and will be asked to limit movement outside of the home to the extent possible. Follow the monitoring steps for close contacts listed on the symptom monitoring form.   ? If you have additional questions, contact your local health department or call the epidemiologist on call at [phone] (available 24/7). ? This  guidance is subject to change. For the most up-to-date guidance from [facility], please refer to their website: YouBlogs.pl

## 2020-10-04 ENCOUNTER — Telehealth: Payer: Self-pay

## 2020-10-04 NOTE — Telephone Encounter (Signed)
Transition Care Management Follow-up Telephone Call  Date of discharge and from where: 10/03/2020 from [facility]  How have you been since you were released from the hospital? Patient is feeling much better and states that she has all of the equipment that was order and that they set it up today.   Any questions or concerns? No  Items Reviewed:  Did the pt receive and understand the discharge instructions provided? Yes   Medications obtained and verified? Yes   Other? No   Any new allergies since your discharge? No   Dietary orders reviewed? Yes  Do you have support at home? Yes   Functional Questionnaire: (I = Independent and D = Dependent) ADLs: I  Bathing/Dressing- I  Meal Prep- I  Eating- I  Maintaining continence- I  Transferring/Ambulation- I  Managing Meds- I  Follow up appointments reviewed:   PCP Hospital f/u appt confirmed? Yes  Scheduled to see [facility] on 10/13/2020 @ 2:00pm.  Are transportation arrangements needed? No  If their condition worsens, is the pt aware to call PCP or go to the Emergency Dept.? Yes Was the patient provided with contact information for the PCP's office or ED? Yes Was to pt encouraged to call back with questions or concerns? Yes

## 2020-10-13 ENCOUNTER — Ambulatory Visit (INDEPENDENT_AMBULATORY_CARE_PROVIDER_SITE_OTHER): Payer: Medicaid Other | Admitting: Nurse Practitioner

## 2020-10-13 VITALS — BP 118/82 | HR 105 | Temp 97.5°F | Wt 180.0 lb

## 2020-10-13 DIAGNOSIS — U071 COVID-19: Secondary | ICD-10-CM

## 2020-10-13 DIAGNOSIS — J1282 Pneumonia due to coronavirus disease 2019: Secondary | ICD-10-CM

## 2020-10-13 DIAGNOSIS — J9601 Acute respiratory failure with hypoxia: Secondary | ICD-10-CM

## 2020-10-13 NOTE — Progress Notes (Signed)
@Patient  ID: , female    DOB: 1990/12/08, 29 y.o.   MRN: [id number]  Chief Complaint  Patient presents with  . Hospitalization Follow-up    COVID 12/15 Received infusion, uses 2L as needed. Still gets a bit winded while moving around the house.     Referring provider: [facility]   29 year old female with no significant medical history.  Recent Significant Events:  COVID-19 vaccinated status: Unvaccinated  Significant Events: 12/15>> Admit to [facility] for hypoxia due to COVID-19  Significant studies: 12/15>>Chest x-ray: Multifocal pneumonia  COVID-19 medications: Steroids: 12/15>> Remdesivir: 12/15>>  Antibiotics: None  HPI  Patient presents today for post COVID care clinic visit/hospital follow-up.  She was admitted to the hospital on 09/28/2020 through 10/03/2020.  She was admitted for acute hypoxic respiratory failure and Covid pneumonia.  She was treated with remdesivir and IV steroids.  She was discharged home on oxygen.  Patient states that she has been doing well since hospital follow-up she has not needed the oxygen in several days.  She has been trying to stay active.  She denies any significant shortness of breath.  She would like for DME company to come and pick up oxygen she states that she will ever needs it.  Patient's O2 sats were stable in the office today on room air. Denies f/c/s, n/v/d, hemoptysis, PND, chest pain or edema.      Allergies  Allergen Reactions  . Penicillins Other (See Comments)    Mother told her she was allergic, has taken Amoxicillin w/o problem    Immunization History  Administered Date(s) Administered  . Tdap 01/11/2020    Past Medical History:  Diagnosis Date  . Gestational diabetes   . History of gestational diabetes mellitus (GDM)   . History of ovarian cyst     Tobacco History: Social History   Tobacco Use  Smoking Status Former Smoker  Smokeless Tobacco Never Used  Tobacco Comment    socially   Counseling given: Not Answered Comment: socially   Outpatient Encounter Medications as of 10/13/2020  Medication Sig  . albuterol (VENTOLIN HFA) 108 (90 Base) MCG/ACT inhaler Inhale 2 puffs into the lungs every 6 (six) hours.  10/15/2020 guaiFENesin-dextromethorphan (ROBITUSSIN DM) 100-10 MG/5ML syrup Take 10 mLs by mouth every 8 (eight) hours as needed for cough. (Patient not taking: Reported on 10/13/2020)  . Phenylephrine-DM-GG-APAP (MUCINEX FAST-MAX COLD/FLU) 5-10-200-325 MG/10ML LIQD Take 1 Dose by mouth as needed (cold and flu). (Patient not taking: Reported on 10/13/2020)   No facility-administered encounter medications on file as of 10/13/2020.     Review of Systems  Review of Systems  Constitutional: Negative.  Negative for fatigue and fever.  HENT: Negative.   Respiratory: Negative for cough and shortness of breath.   Cardiovascular: Negative.  Negative for chest pain, palpitations and leg swelling.  Gastrointestinal: Negative.   Allergic/Immunologic: Negative.   Neurological: Negative.   Psychiatric/Behavioral: Negative.        Physical Exam  BP 118/82 (BP Location: Left Arm)   Pulse (!) 105   Temp (!) 97.5 F (36.4 C)   Wt 180 lb 0.1 oz (81.6 kg)   SpO2 98%   BMI 30.90 kg/m   Wt Readings from Last 5 Encounters:  10/13/20 180 lb 0.1 oz (81.6 kg)  10/02/20 182 lb 1.6 oz (82.6 kg)  07/26/20 188 lb 3.2 oz (85.4 kg)  05/10/20 195 lb 9.6 oz (88.7 kg)  03/14/20 197 lb (89.4 kg)     Physical Exam  Vitals and nursing note reviewed.  Constitutional:      General: She is not in acute distress.    Appearance: She is well-developed and well-nourished.  Cardiovascular:     Rate and Rhythm: Normal rate and regular rhythm.  Pulmonary:     Effort: Pulmonary effort is normal.     Breath sounds: Normal breath sounds.  Musculoskeletal:     Right lower leg: No edema.     Left lower leg: No edema.  Neurological:     Mental Status: She is alert and oriented  to person, place, and time.  Psychiatric:        Mood and Affect: Mood and affect and mood normal.        Behavior: Behavior normal.       Imaging: CT Angio Chest PE W and/or Wo Contrast  Result Date: 09/28/2020 CLINICAL DATA:  COVID cough difficulty breathing EXAM: CT ANGIOGRAPHY CHEST WITH CONTRAST TECHNIQUE: Multidetector CT imaging of the chest was performed using the standard protocol during bolus administration of intravenous contrast. Multiplanar CT image reconstructions and MIPs were obtained to evaluate the vascular anatomy. CONTRAST:  60 mL OMNIPAQUE IOHEXOL 350 MG/ML SOLN COMPARISON:  Chest x-ray 09/28/2020 FINDINGS: Cardiovascular: Satisfactory opacification of the pulmonary arteries to the segmental level. No evidence of pulmonary embolism. Nonaneurysmal aorta. Normal cardiac size. No pericardial effusion. Mediastinum/Nodes: No enlarged mediastinal, hilar, or axillary lymph nodes. Thyroid gland, trachea, and esophagus demonstrate no significant findings. Lungs/Pleura: Fairly extensive bilateral ground-glass densities and consolidations within the right greater than left lungs. No pleural effusion or pneumothorax. Upper Abdomen: No acute abnormality. Musculoskeletal: No chest wall abnormality. No acute or significant osseous findings. Review of the MIP images confirms the above findings. IMPRESSION: 1. Negative for acute pulmonary embolus. 2. Fairly extensive bilateral ground-glass densities and consolidations within the right greater than left lungs, consistent with bilateral pneumonia and history of COVID positivity. Electronically Signed   By: Jasmine Pang M.D.   On: 09/28/2020 21:30   DG Chest Portable 1 View  Result Date: 09/28/2020 CLINICAL DATA:  COVID positive. Chest congestion. Productive cough. EXAM: PORTABLE CHEST 1 VIEW COMPARISON:  None. FINDINGS: Heart size is normal. Lung volumes are low. Patchy bilateral airspace opacities are present in the right middle lobe and left  lower lobe predominantly. Left effusion is present. Axial skeleton is unremarkable. IMPRESSION: 1. Patchy bilateral airspace disease compatible with multifocal pneumonia. 2. Left pleural effusion. 3. Low lung volumes. Electronically Signed   By: Marin Roberts M.D.   On: 09/28/2020 15:38     Assessment & Plan:   Pneumonia due to COVID-19 virus Stay well hydrated  Stay active  Deep breathing exercises  May start vitamin C daily, vitamin D3 daily, Zinc daily  May take tylenol for fever or pain  May take mucinex twice daily  Will check labs   Follow up:  Follow up in 2 weeks or sooner if needed - will need repeat imaging       Ivonne Andrew, NP 10/13/2020

## 2020-10-13 NOTE — Patient Instructions (Addendum)
Covid 19 pneumonia:   Stay well hydrated  Stay active  Deep breathing exercises  May start vitamin C daily, vitamin D3 daily, Zinc daily  May take tylenol for fever or pain  May take mucinex twice daily  Will check labs   Follow up:  Follow up in 2 weeks or sooner if needed - will need repeat imaging

## 2020-10-13 NOTE — Assessment & Plan Note (Signed)
Stay well hydrated  Stay active  Deep breathing exercises  May start vitamin C daily, vitamin D3 daily, Zinc daily  May take tylenol for fever or pain  May take mucinex twice daily  Will check labs   Follow up:  Follow up in 2 weeks or sooner if needed - will need repeat imaging

## 2020-10-14 LAB — COMPREHENSIVE METABOLIC PANEL
ALT: 71 IU/L — ABNORMAL HIGH (ref 0–32)
AST: 29 IU/L (ref 0–40)
Albumin/Globulin Ratio: 1.6 (ref 1.2–2.2)
Albumin: 4.1 g/dL (ref 3.8–4.8)
Alkaline Phosphatase: 103 IU/L (ref 44–121)
BUN/Creatinine Ratio: 7 — ABNORMAL LOW (ref 9–23)
BUN: 5 mg/dL — ABNORMAL LOW (ref 6–20)
Bilirubin Total: 0.4 mg/dL (ref 0.0–1.2)
CO2: 23 mmol/L (ref 20–29)
Calcium: 9.1 mg/dL (ref 8.7–10.2)
Chloride: 105 mmol/L (ref 96–106)
Creatinine, Ser: 0.7 mg/dL (ref 0.57–1.00)
GFR calc Af Amer: 134 mL/min/{1.73_m2} (ref >59)
GFR calc non Af Amer: 116 mL/min/{1.73_m2} (ref >59)
Globulin, Total: 2.5 g/dL (ref 1.5–4.5)
Glucose: 123 mg/dL — ABNORMAL HIGH (ref 65–99)
Potassium: 4 mmol/L (ref 3.5–5.2)
Sodium: 141 mmol/L (ref 134–144)
Total Protein: 6.6 g/dL (ref 6.0–8.5)

## 2020-10-14 LAB — CBC
Hematocrit: 33.3 % — ABNORMAL LOW (ref 34.0–46.6)
Hemoglobin: 11.1 g/dL (ref 11.1–15.9)
MCH: 27.2 pg (ref 26.6–33.0)
MCHC: 33.3 g/dL (ref 31.5–35.7)
MCV: 82 fL (ref 79–97)
Platelets: 255 10*3/uL (ref 150–450)
RBC: 4.08 x10E6/uL (ref 3.77–5.28)
RDW: 13.7 % (ref 11.7–15.4)
WBC: 10 10*3/uL (ref 3.4–10.8)

## 2020-10-31 ENCOUNTER — Telehealth: Payer: Medicaid Other

## 2020-10-31 ENCOUNTER — Other Ambulatory Visit: Payer: Self-pay

## 2020-11-03 ENCOUNTER — Telehealth (INDEPENDENT_AMBULATORY_CARE_PROVIDER_SITE_OTHER): Payer: Medicaid Other | Admitting: Nurse Practitioner

## 2020-11-03 DIAGNOSIS — J9601 Acute respiratory failure with hypoxia: Secondary | ICD-10-CM | POA: Diagnosis not present

## 2020-11-03 DIAGNOSIS — J1282 Pneumonia due to coronavirus disease 2019: Secondary | ICD-10-CM | POA: Diagnosis not present

## 2020-11-03 DIAGNOSIS — U071 COVID-19: Secondary | ICD-10-CM | POA: Diagnosis not present

## 2020-11-03 NOTE — Patient Instructions (Signed)
Pneumonia due to COVID-19 virus Stay well hydrated  Stay active  Deep breathing exercises  May start vitamin C daily, vitamin D3 daily, Zinc daily  May take tylenol for fever or pain  May take mucinex twice daily  Will order chest xray

## 2020-11-03 NOTE — Progress Notes (Signed)
Virtual Visit via Telephone Note  I connected with Elfredia Nevins on 11/03/20 at  8:30 AM EST by telephone and verified that I am speaking with the correct person using two identifiers.  Location: Patient: home Provider: remote   I discussed the limitations, risks, security and privacy concerns of performing an evaluation and management service by telephone and the availability of in person appointments. I also discussed with the patient that there may be a patient responsible charge related to this service. The patient expressed understanding and agreed to proceed.  Chief Complaint  Patient presents with  . Follow-up    Feeling much improved      History of Present Illness:  Patient presents today for televisit for follow-up.  She was last seen here on 10/13/2020 for hospital follow-up.  She states that she has been doing well.  She has not needed her oxygen.  Overall back to her baseline.  She does need repeat imaging. Denies f/c/s, n/v/d, hemoptysis, PND, chest pain or edema.     Observations/Objective:  Vitals with BMI 10/13/2020 10/03/2020 10/02/2020  Height - - -  Weight 180 lbs - -  BMI 30.88 - -  Systolic 118 135 -  Diastolic 82 89 -  Pulse 105 66 79      Assessment and Plan:  Pneumonia due to COVID-19 virus Stay well hydrated  Stay active  Deep breathing exercises  May start vitamin C daily, vitamin D3 daily, Zinc daily  May take tylenol for fever or pain  May take mucinex twice daily  Will order chest xray    Follow Up Instructions:  Follow up if needed    I discussed the assessment and treatment plan with the patient. The patient was provided an opportunity to ask questions and all were answered. The patient agreed with the plan and demonstrated an understanding of the instructions.   The patient was advised to call back or seek an in-person evaluation if the symptoms worsen or if the condition fails to improve as  anticipated.  I provided 22 minutes of non-face-to-face time during this encounter.   Ivonne Andrew, NP

## 2020-11-07 ENCOUNTER — Other Ambulatory Visit: Payer: Self-pay

## 2020-11-07 ENCOUNTER — Ambulatory Visit (INDEPENDENT_AMBULATORY_CARE_PROVIDER_SITE_OTHER): Payer: Medicaid Other | Admitting: Obstetrics and Gynecology

## 2020-11-07 ENCOUNTER — Encounter: Payer: Self-pay | Admitting: Obstetrics and Gynecology

## 2020-11-07 VITALS — BP 116/78 | HR 83 | Ht 64.0 in | Wt 183.0 lb

## 2020-11-07 DIAGNOSIS — Z3009 Encounter for other general counseling and advice on contraception: Secondary | ICD-10-CM | POA: Diagnosis not present

## 2020-11-07 DIAGNOSIS — Z01419 Encounter for gynecological examination (general) (routine) without abnormal findings: Secondary | ICD-10-CM

## 2020-11-07 DIAGNOSIS — Z3042 Encounter for surveillance of injectable contraceptive: Secondary | ICD-10-CM | POA: Diagnosis not present

## 2020-11-07 MED ORDER — MEDROXYPROGESTERONE ACETATE 150 MG/ML IM SUSP
150.0000 mg | Freq: Once | INTRAMUSCULAR | Status: AC
  Administered 2020-11-07: 150 mg via INTRAMUSCULAR

## 2020-11-07 NOTE — Progress Notes (Signed)
   History:  [name] is a 30 y.o. [id number] who presents to clinic today for annual gyn exam.   Covid hospitalization in December - recovering still, starting to resume exercise again. Does feel that it was a pretty traumatizing experience. Would like to get vaccinated when able, considering having 30 year old get vaccinated as well.   Had baby in May, breast feeding still. Recovered well overall. Sexually active, denies pain but endorses decreased libido. Feels safe and supported by partner.  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 She is fine with this method overall but interested in learning about other options, especially with lower amounts of hormone.  No new sexual partner.   The following portions of the patient's history were reviewed and updated as appropriate: allergies, current medications, family history, past medical history, social history, past surgical history and problem list.  Review of Systems:  Review of Systems  Constitutional: Negative.   Cardiovascular: Negative.   Genitourinary: Negative for dysuria.  Neurological: Negative for dizziness and headaches.  Psychiatric/Behavioral: Negative for depression.      Objective:  Physical Exam BP 116/78   Pulse 83   Ht 5\' 4"  (1.626 m)   Wt 183 lb (83 kg)   LMP  (LMP Unknown)   BMI 31.41 kg/m  Physical Exam Vitals and nursing note reviewed.  Constitutional:      Appearance: Normal appearance.  Cardiovascular:     Rate and Rhythm: Normal rate and regular rhythm.  Pulmonary:     Effort: Pulmonary effort is normal.  Chest:     Chest wall: No mass.  Breasts: Breasts are symmetrical.     Right: No mass.     Left: Normal. No mass.    Abdominal:     General: There is no distension.     Palpations: Abdomen is soft. There is no mass.     Tenderness: There is no abdominal tenderness.  Skin:    General: Skin is warm and dry.  Neurological:     Mental Status: She is alert.  Psychiatric:         Mood and Affect: Mood normal.        Behavior: Behavior normal.       Labs and Imaging No results found for this or any previous visit (from the past 24 hour(s)).  No results found.   Assessment & Plan:   Routine Gynecologic Exam -last pap normal, neg HPV on 10/19/2019 - not due for pap until 10/2024. Discussed with patient. -counseled on various options for contraception. Does not desire any further kids. Considering IUD. Provided with information handout.  -declines STD testing today    11/2024, MD 11/07/2020 9:16 AM    11/09/2020, MD [facility] Family Medicine Fellow, [facility]

## 2020-11-07 NOTE — Patient Instructions (Signed)

## 2020-11-09 ENCOUNTER — Encounter: Payer: Self-pay | Admitting: Nurse Practitioner

## 2020-11-09 ENCOUNTER — Other Ambulatory Visit: Payer: Self-pay

## 2020-11-09 ENCOUNTER — Ambulatory Visit (INDEPENDENT_AMBULATORY_CARE_PROVIDER_SITE_OTHER): Payer: Medicaid Other | Admitting: Nurse Practitioner

## 2020-11-09 VITALS — BP 104/67 | HR 86 | Temp 97.9°F | Ht 64.0 in | Wt 183.4 lb

## 2020-11-09 DIAGNOSIS — Z1159 Encounter for screening for other viral diseases: Secondary | ICD-10-CM | POA: Diagnosis not present

## 2020-11-09 DIAGNOSIS — Z0000 Encounter for general adult medical examination without abnormal findings: Secondary | ICD-10-CM

## 2020-11-09 DIAGNOSIS — Z8616 Personal history of COVID-19: Secondary | ICD-10-CM | POA: Diagnosis not present

## 2020-11-09 DIAGNOSIS — Z1329 Encounter for screening for other suspected endocrine disorder: Secondary | ICD-10-CM

## 2020-11-09 LAB — POCT URINALYSIS DIPSTICK
Bilirubin, UA: NEGATIVE
Blood, UA: NEGATIVE
Glucose, UA: NEGATIVE
Ketones, UA: NEGATIVE
Leukocytes, UA: NEGATIVE
Nitrite, UA: NEGATIVE
Protein, UA: NEGATIVE
Spec Grav, UA: >=1.03 — AB (ref 1.010–1.025)
Urobilinogen, UA: 0.2 E.U./dL
pH, UA: 5.5 (ref 5.0–8.0)

## 2020-11-09 NOTE — Progress Notes (Signed)
[facility] Patient [facility] [address] [place], [place]  [address] Phone:  [phone]   Fax:  [phone]   New Patient Office Visit  Subjective:  Patient ID: Lauren Delacruz, female    DOB: July 03, 1991  Age: 30 y.o. MRN: [id number]  CC:  Chief Complaint  Patient presents with  . New Patient (Initial Visit)    Referred from post covid center, had covid 30 days ago. Having pain in throat swelling in throat sore to touch.     HPI Kaelie Henigan presents to establish care. She  has a past medical history of COVID-19 (09/2020), Gestational diabetes, History of gestational diabetes mellitus (GDM), History of ovarian cyst, and Pneumonia (09/2020).   Sore Throat Patient complains of sore throat. Associated symptoms include enlarged tonsils and swollen glands. Onset of symptoms was  several days ago, and have been unchanged since that time. She is drinking moderate amounts of fluids. She has not had recent close exposure to someone with proven streptococcal pharyngitis. She feels like this is a post COVID symptoms possibly.   Past Medical History:  Diagnosis Date  . COVID-19 09/2020  . Gestational diabetes   . History of gestational diabetes mellitus (GDM)   . History of ovarian cyst   . Pneumonia 09/2020    Past Surgical History:  Procedure Laterality Date  . WISDOM TOOTH EXTRACTION  2019    Family History  Problem Relation Age of Onset  . Hypertension Mother   . Hypertension Father   . Prostate cancer Father   . Breast cancer Paternal Aunt   . Hypertension Maternal Grandmother     Social History   Socioeconomic History  . Marital status: Single    Spouse name: Not on file  . Number of children: Not on file  . Years of education: Not on file  . Highest education level: Not on file  Occupational History  . Not on file  Tobacco Use  . Smoking status: Former Games developer  . Smokeless tobacco: Never Used  . Tobacco comment: socially  Vaping Use  . Vaping  Use: Never used  Substance and Sexual Activity  . Alcohol use: Not Currently    Comment: socially  . Drug use: Never  . Sexual activity: Yes    Partners: Male    Birth control/protection: Injection  Other Topics Concern  . Not on file  Social History Narrative  . Not on file   Social Determinants of Health   Financial Resource Strain: Not on file  Food Insecurity: No Food Insecurity  . Worried About Programme researcher, broadcasting/film/video in the Last Year: Never true  . Ran Out of Food in the Last Year: Never true  Transportation Needs: No Transportation Needs  . Lack of Transportation (Medical): No  . Lack of Transportation (Non-Medical): No  Physical Activity: Not on file  Stress: Not on file  Social Connections: Not on file  Intimate Partner Violence: Not on file    ROS Review of Systems  Constitutional: Negative.   HENT: Positive for sore throat (tender to the touch). Negative for trouble swallowing and voice change.   Eyes:       Glasses  Respiratory: Negative for shortness of breath.   Cardiovascular: Negative for chest pain.  Gastrointestinal: Negative for constipation, diarrhea, nausea and vomiting.  Endocrine: Negative for cold intolerance and heat intolerance.  Genitourinary: Negative.   Musculoskeletal: Negative.   Skin: Negative.   Allergic/Immunologic: Positive for food allergies.  Hematological: Negative.  Psychiatric/Behavioral: Negative.     Objective:   Today's Vitals: BP 104/67 (BP Location: Left Arm, Patient Position: Sitting, Cuff Size: Normal)   Pulse 86   Temp 97.9 F (36.6 C) (Temporal)   Ht 5\' 4"  (1.626 m)   Wt 183 lb 6.4 oz (83.2 kg)   LMP  (LMP Unknown)   SpO2 99%   BMI 31.48 kg/m   Physical Exam Constitutional:      General: She is not in acute distress.    Appearance: She is obese. She is not ill-appearing, toxic-appearing or diaphoretic.  HENT:     Head: Normocephalic and atraumatic.     Nose: Nose normal.     Mouth/Throat:     Mouth:  Mucous membranes are moist.  Neck:     Thyroid: Thyromegaly present.  Cardiovascular:     Rate and Rhythm: Normal rate and regular rhythm.     Pulses: Normal pulses.     Heart sounds: Normal heart sounds.  Pulmonary:     Effort: Pulmonary effort is normal.     Breath sounds: Normal breath sounds.  Abdominal:     General: Bowel sounds are normal.     Palpations: Abdomen is soft.  Musculoskeletal:        General: Normal range of motion.     Cervical back: Normal range of motion.  Lymphadenopathy:     Cervical: No cervical adenopathy.     Right cervical: No superficial, deep or posterior cervical adenopathy.    Left cervical: No superficial, deep or posterior cervical adenopathy.  Skin:    General: Skin is warm and dry.     Capillary Refill: Capillary refill takes less than 2 seconds.  Neurological:     General: No focal deficit present.     Mental Status: She is alert and oriented to person, place, and time.  Psychiatric:        Mood and Affect: Mood normal.        Behavior: Behavior normal.        Thought Content: Thought content normal.        Judgment: Judgment normal.     Assessment & Plan:   Problem List Items Addressed This Visit   None   Visit Diagnoses    Encounter for health maintenance examination    -  Primary Discussed female health maintenance; SBE, annual CBE, PAP test Discussed general safety in vehicle and COVID Discussed regular hydration with water Discussed healthy diet and exercise and weight management Discussed sexual health  Discussed mental health Encouraged to call our office for an appointment with in ongoing concerns for questions.    Relevant Orders   POCT Urinalysis Dipstick (Completed)   Personal history of COVID-19     Full discussed on vaccinations reinfection and prevention   Relevant Orders   Comp. Metabolic Panel (12)   CBC with Differential/Platelet   Encounter for hepatitis C screening test for low risk patient     Discussed  CDC recommendation and risk associated with tattoo ink etc.    Relevant Orders   Hepatitis C antibody   Screening for thyroid disorder     Evaluation with TSH if abnormal will add additional screening labs. due to fullness in her neck and current complaints If throat discomfort continues may consider ultrasound   Relevant Orders   TSH      Outpatient Encounter Medications as of 11/09/2020  Medication Sig  . albuterol (VENTOLIN HFA) 108 (90 Base) MCG/ACT inhaler Inhale 2 puffs into  the lungs every 6 (six) hours.  . Ferrous Sulfate (IRON PO) Take 1 tablet by mouth daily.  Marland Kitchen VITAMIN D PO Take 1 tablet by mouth daily.  . [DISCONTINUED] guaiFENesin-dextromethorphan (ROBITUSSIN DM) 100-10 MG/5ML syrup Take 10 mLs by mouth every 8 (eight) hours as needed for cough.  . [DISCONTINUED] Phenylephrine-DM-GG-APAP (MUCINEX FAST-MAX COLD/FLU) 5-10-200-325 MG/10ML LIQD Take 1 Dose by mouth as needed (cold and flu).   No facility-administered encounter medications on file as of 11/09/2020.    Follow-up: Return in about 1 year (around 11/09/2021) for Physcial PREVENTIVE VISIT,EST,18-39 [[id number]].   Barbette Merino, NP

## 2020-11-09 NOTE — Patient Instructions (Signed)
Health Maintenance, Female Adopting a healthy lifestyle and getting preventive care are important in promoting health and wellness. Ask your health care provider about:  The right schedule for you to have regular tests and exams.  Things you can do on your own to prevent diseases and keep yourself healthy. What should I know about diet, weight, and exercise? Eat a healthy diet  Eat a diet that includes plenty of vegetables, fruits, low-fat dairy products, and lean protein.  Do not eat a lot of foods that are high in solid fats, added sugars, or sodium.   Maintain a healthy weight Body mass index (BMI) is used to identify weight problems. It estimates body fat based on height and weight. Your health care provider can help determine your BMI and help you achieve or maintain a healthy weight. Get regular exercise Get regular exercise. This is one of the most important things you can do for your health. Most adults should:  Exercise for at least 150 minutes each week. The exercise should increase your heart rate and make you sweat (moderate-intensity exercise).  Do strengthening exercises at least twice a week. This is in addition to the moderate-intensity exercise.  Spend less time sitting. Even light physical activity can be beneficial. Watch cholesterol and blood lipids Have your blood tested for lipids and cholesterol at 32 years of age, then have this test every 5 years. Have your cholesterol levels checked more often if:  Your lipid or cholesterol levels are high.  You are older than 32 years of age.  You are at high risk for heart disease. What should I know about cancer screening? Depending on your health history and family history, you may need to have cancer screening at various ages. This may include screening for:  Breast cancer.  Cervical cancer.  Colorectal cancer.  Skin cancer.  Lung cancer. What should I know about heart disease, diabetes, and high blood  pressure? Blood pressure and heart disease  High blood pressure causes heart disease and increases the risk of stroke. This is more likely to develop in people who have high blood pressure readings, are of African descent, or are overweight.  Have your blood pressure checked: ? Every 3-5 years if you are 18-39 years of age. ? Every year if you are 40 years old or older. Diabetes Have regular diabetes screenings. This checks your fasting blood sugar level. Have the screening done:  Once every three years after age 40 if you are at a normal weight and have a low risk for diabetes.  More often and at a younger age if you are overweight or have a high risk for diabetes. What should I know about preventing infection? Hepatitis B If you have a higher risk for hepatitis B, you should be screened for this virus. Talk with your health care provider to find out if you are at risk for hepatitis B infection. Hepatitis C Testing is recommended for:  Everyone born from 1945 through 1965.  Anyone with known risk factors for hepatitis C. Sexually transmitted infections (STIs)  Get screened for STIs, including gonorrhea and chlamydia, if: ? You are sexually active and are younger than 32 years of age. ? You are older than 32 years of age and your health care provider tells you that you are at risk for this type of infection. ? Your sexual activity has changed since you were last screened, and you are at increased risk for chlamydia or gonorrhea. Ask your health care provider   if you are at risk.  Ask your health care provider about whether you are at high risk for HIV. Your health care provider may recommend a prescription medicine to help prevent HIV infection. If you choose to take medicine to prevent HIV, you should first get tested for HIV. You should then be tested every 3 months for as long as you are taking the medicine. Pregnancy  If you are about to stop having your period (premenopausal) and  you may become pregnant, seek counseling before you get pregnant.  Take 400 to 800 micrograms (mcg) of folic acid every day if you become pregnant.  Ask for birth control (contraception) if you want to prevent pregnancy. Osteoporosis and menopause Osteoporosis is a disease in which the bones lose minerals and strength with aging. This can result in bone fractures. If you are 65 years old or older, or if you are at risk for osteoporosis and fractures, ask your health care provider if you should:  Be screened for bone loss.  Take a calcium or vitamin D supplement to lower your risk of fractures.  Be given hormone replacement therapy (HRT) to treat symptoms of menopause. Follow these instructions at home: Lifestyle  Do not use any products that contain nicotine or tobacco, such as cigarettes, e-cigarettes, and chewing tobacco. If you need help quitting, ask your health care provider.  Do not use street drugs.  Do not share needles.  Ask your health care provider for help if you need support or information about quitting drugs. Alcohol use  Do not drink alcohol if: ? Your health care provider tells you not to drink. ? You are pregnant, may be pregnant, or are planning to become pregnant.  If you drink alcohol: ? Limit how much you use to 0-1 drink a day. ? Limit intake if you are breastfeeding.  Be aware of how much alcohol is in your drink. In the [place] one drink equals one 12 oz bottle of beer (355 mL), one 5 oz glass of wine (148 mL), or one 1 oz glass of hard liquor (44 mL). General instructions  Schedule regular health, dental, and eye exams.  Stay current with your vaccines.  Tell your health care provider if: ? You often feel depressed. ? You have ever been abused or do not feel safe at home. Summary  Adopting a healthy lifestyle and getting preventive care are important in promoting health and wellness.  Follow your health care provider's instructions about healthy  diet, exercising, and getting tested or screened for diseases.  Follow your health care provider's instructions on monitoring your cholesterol and blood pressure. This information is not intended to replace advice given to you by your health care provider. Make sure you discuss any questions you have with your health care provider. Document Revised: 09/24/2018 Document Reviewed: 09/24/2018 Elsevier Patient Education  2021 Elsevier Inc.  

## 2020-11-10 LAB — CBC WITH DIFFERENTIAL/PLATELET
Basophils Absolute: 0 10*3/uL (ref 0.0–0.2)
Basos: 0 %
EOS (ABSOLUTE): 0.1 10*3/uL (ref 0.0–0.4)
Eos: 1 %
Hematocrit: 37.9 % (ref 34.0–46.6)
Hemoglobin: 12 g/dL (ref 11.1–15.9)
Immature Grans (Abs): 0 10*3/uL (ref 0.0–0.1)
Immature Granulocytes: 0 %
Lymphocytes Absolute: 2.4 10*3/uL (ref 0.7–3.1)
Lymphs: 27 %
MCH: 26.5 pg — ABNORMAL LOW (ref 26.6–33.0)
MCHC: 31.7 g/dL (ref 31.5–35.7)
MCV: 84 fL (ref 79–97)
Monocytes Absolute: 0.5 10*3/uL (ref 0.1–0.9)
Monocytes: 5 %
Neutrophils Absolute: 5.9 10*3/uL (ref 1.4–7.0)
Neutrophils: 67 %
Platelets: 399 10*3/uL (ref 150–450)
RBC: 4.52 x10E6/uL (ref 3.77–5.28)
RDW: 14.1 % (ref 11.7–15.4)
WBC: 8.9 10*3/uL (ref 3.4–10.8)

## 2020-11-10 LAB — COMP. METABOLIC PANEL (12)
AST: 15 IU/L (ref 0–40)
Albumin/Globulin Ratio: 1.5 (ref 1.2–2.2)
Albumin: 4.6 g/dL (ref 3.8–4.8)
Alkaline Phosphatase: 106 IU/L (ref 44–121)
BUN/Creatinine Ratio: 13 (ref 9–23)
BUN: 9 mg/dL (ref 6–20)
Bilirubin Total: <0.2 mg/dL (ref 0.0–1.2)
Calcium: 10.3 mg/dL — ABNORMAL HIGH (ref 8.7–10.2)
Chloride: 105 mmol/L (ref 96–106)
Creatinine, Ser: 0.72 mg/dL (ref 0.57–1.00)
GFR calc Af Amer: 128 mL/min/{1.73_m2} (ref >59)
GFR calc non Af Amer: 111 mL/min/{1.73_m2} (ref >59)
Globulin, Total: 3 g/dL (ref 1.5–4.5)
Glucose: 98 mg/dL (ref 65–99)
Potassium: 4.8 mmol/L (ref 3.5–5.2)
Sodium: 140 mmol/L (ref 134–144)
Total Protein: 7.6 g/dL (ref 6.0–8.5)

## 2020-11-10 LAB — HEPATITIS C ANTIBODY: Hep C Virus Ab: 0.7 s/co ratio (ref 0.0–0.9)

## 2020-11-10 LAB — TSH: TSH: 0.96 u[IU]/mL (ref 0.450–4.500)

## 2020-12-04 DIAGNOSIS — J9601 Acute respiratory failure with hypoxia: Secondary | ICD-10-CM | POA: Diagnosis not present

## 2021-01-01 DIAGNOSIS — J9601 Acute respiratory failure with hypoxia: Secondary | ICD-10-CM | POA: Diagnosis not present

## 2021-01-23 ENCOUNTER — Other Ambulatory Visit: Payer: Self-pay

## 2021-01-23 ENCOUNTER — Ambulatory Visit (INDEPENDENT_AMBULATORY_CARE_PROVIDER_SITE_OTHER): Payer: Medicaid Other | Admitting: Lactation Services

## 2021-01-23 ENCOUNTER — Encounter: Payer: Self-pay | Admitting: Lactation Services

## 2021-01-23 DIAGNOSIS — Z3042 Encounter for surveillance of injectable contraceptive: Secondary | ICD-10-CM

## 2021-01-23 MED ORDER — MEDROXYPROGESTERONE ACETATE 150 MG/ML IM SUSP
150.0000 mg | Freq: Once | INTRAMUSCULAR | Status: AC
  Administered 2021-01-23: 150 mg via INTRAMUSCULAR

## 2021-01-23 NOTE — Progress Notes (Signed)
Chart reviewed for nurse visit. Agree with plan of care.   Duane Lope, NP 01/23/2021 1:49 PM

## 2021-01-23 NOTE — Progress Notes (Signed)
Lauren Delacruz here for Depo-Provera Injection. Injection administered without complication. Patient will return in 3 months for next injection between 04/10/2021 and 04/24/2021. Next annual visit due July 2023.     Ed Blalock, RN 01/23/2021  10:22 AM

## 2021-02-01 DIAGNOSIS — J9601 Acute respiratory failure with hypoxia: Secondary | ICD-10-CM | POA: Diagnosis not present

## 2021-03-03 DIAGNOSIS — J9601 Acute respiratory failure with hypoxia: Secondary | ICD-10-CM | POA: Diagnosis not present

## 2021-04-13 ENCOUNTER — Ambulatory Visit: Payer: Medicaid Other

## 2021-04-18 ENCOUNTER — Other Ambulatory Visit: Payer: Self-pay

## 2021-04-18 ENCOUNTER — Ambulatory Visit (INDEPENDENT_AMBULATORY_CARE_PROVIDER_SITE_OTHER): Payer: Medicaid Other | Admitting: *Deleted

## 2021-04-18 VITALS — BP 121/86 | HR 94 | Ht 64.0 in | Wt 168.0 lb

## 2021-04-18 DIAGNOSIS — Z3042 Encounter for surveillance of injectable contraceptive: Secondary | ICD-10-CM | POA: Diagnosis not present

## 2021-04-18 MED ORDER — MEDROXYPROGESTERONE ACETATE 150 MG/ML IM SUSP
150.0000 mg | Freq: Once | INTRAMUSCULAR | Status: AC
  Administered 2021-04-18: 10:00:00 150 mg via INTRAMUSCULAR

## 2021-04-18 NOTE — Progress Notes (Signed)
Date last pap: 10/2019. Last Annual exam: 10/2020/ Last Depo-Provera: 01/23/21. Side Effects if any: none. Serum HCG indicated? no. Depo-Provera 150 mg IM given by: Sayda Grable,RN. Patient tolerated without complaint. . Next appointment due 09/20//-07/18/21 Endrit Gittins,RN.

## 2021-05-02 NOTE — Progress Notes (Signed)
Chart reviewed for nurse visit. Agree with plan of care.   Teosha Casso Lorraine, CNM 05/02/2021 5:19 PM   

## 2021-07-17 ENCOUNTER — Ambulatory Visit (INDEPENDENT_AMBULATORY_CARE_PROVIDER_SITE_OTHER): Payer: Medicaid Other

## 2021-07-17 ENCOUNTER — Other Ambulatory Visit: Payer: Self-pay

## 2021-07-17 VITALS — BP 125/81 | HR 78 | Wt 165.1 lb

## 2021-07-17 DIAGNOSIS — Z3042 Encounter for surveillance of injectable contraceptive: Secondary | ICD-10-CM

## 2021-07-17 MED ORDER — MEDROXYPROGESTERONE ACETATE 150 MG/ML IM SUSP
150.0000 mg | Freq: Once | INTRAMUSCULAR | Status: AC
Start: 2021-07-17 — End: 2021-07-17
  Administered 2021-07-17: 150 mg via INTRAMUSCULAR

## 2021-07-17 NOTE — Progress Notes (Signed)
Lauren Delacruz here for Depo-Provera Injection. Injection administered without complication. Patient will return in 3 months for next injection between 10/02/21 and 10/16/21. Next annual visit due January 2023.   Marjo Bicker, RN 07/17/2021  9:40 AM

## 2021-10-02 ENCOUNTER — Ambulatory Visit: Payer: Medicaid Other

## 2021-10-17 ENCOUNTER — Ambulatory Visit (INDEPENDENT_AMBULATORY_CARE_PROVIDER_SITE_OTHER): Payer: Medicaid Other

## 2021-10-17 ENCOUNTER — Other Ambulatory Visit: Payer: Self-pay

## 2021-10-17 VITALS — BP 125/79 | HR 89 | Wt 163.9 lb

## 2021-10-17 DIAGNOSIS — Z3042 Encounter for surveillance of injectable contraceptive: Secondary | ICD-10-CM

## 2021-10-17 DIAGNOSIS — Z3202 Encounter for pregnancy test, result negative: Secondary | ICD-10-CM

## 2021-10-17 LAB — POCT PREGNANCY, URINE: Preg Test, Ur: NEGATIVE

## 2021-10-17 MED ORDER — MEDROXYPROGESTERONE ACETATE 150 MG/ML IM SUSP
150.0000 mg | Freq: Once | INTRAMUSCULAR | Status: AC
  Administered 2021-10-17: 150 mg via INTRAMUSCULAR

## 2021-10-17 NOTE — Progress Notes (Signed)
Axel Filler here for Depo-Provera Injection. Injection administered without complication. Patient will return in 3 months for next injection between March 21 and April 4. Next annual visit due with next Depo Provera.   Verdell Carmine, RN 10/17/2021  1:53 PM

## 2021-11-09 ENCOUNTER — Ambulatory Visit: Payer: Medicaid Other | Admitting: Nurse Practitioner

## 2021-12-13 ENCOUNTER — Ambulatory Visit: Payer: Medicaid Other | Admitting: Student

## 2021-12-28 ENCOUNTER — Ambulatory Visit (INDEPENDENT_AMBULATORY_CARE_PROVIDER_SITE_OTHER): Payer: Medicaid Other | Admitting: Certified Nurse Midwife

## 2021-12-28 ENCOUNTER — Other Ambulatory Visit (HOSPITAL_COMMUNITY)
Admission: RE | Admit: 2021-12-28 | Discharge: 2021-12-28 | Disposition: A | Discharge status: Home / Self Care | Payer: Medicaid Other | Source: Ambulatory Visit | Attending: Student | Admitting: Student

## 2021-12-28 ENCOUNTER — Other Ambulatory Visit: Payer: Self-pay

## 2021-12-28 ENCOUNTER — Encounter: Payer: Self-pay | Admitting: Certified Nurse Midwife

## 2021-12-28 VITALS — BP 122/82 | HR 83 | Wt 158.0 lb

## 2021-12-28 DIAGNOSIS — Z113 Encounter for screening for infections with a predominantly sexual mode of transmission: Secondary | ICD-10-CM | POA: Diagnosis not present

## 2021-12-28 DIAGNOSIS — Z3042 Encounter for surveillance of injectable contraceptive: Secondary | ICD-10-CM | POA: Diagnosis not present

## 2021-12-28 MED ORDER — MEDROXYPROGESTERONE ACETATE 150 MG/ML IM SUSP
150.0000 mg | Freq: Once | INTRAMUSCULAR | Status: AC
  Administered 2021-12-28: 150 mg via INTRAMUSCULAR

## 2021-12-28 NOTE — Progress Notes (Signed)
? ?ANNUAL EXAM ?Patient name: Lauren Delacruz MRN [id number]  Date of birth: May 31, 1991 ?Chief Complaint:   ?Gynecologic Exam ? ?History of Present Illness:   ?Lauren Delacruz is a 31 y.o. [phone] African-American female being seen today for a routine annual exam.  ?Current complaints: Ongoing brown, watery, malodorous discharge and a painful lump behind her labia. ? ?No LMP recorded. Patient has had an injection. ? ?The pregnancy intention screening data noted above was reviewed. Potential methods of contraception were discussed. The patient elected to proceed with No data recorded.  ? ?Last pap 10/19/19. Results were: NILM w/ HRHPV negative. H/O abnormal pap: no ?Last mammogram: N/A. Family h/o breast cancer: no ?Last colonoscopy: N/A.  Family h/o colorectal cancer: no ? ?Depression screen [facility] 2/9 04/18/2021 11/09/2020 11/07/2020 02/26/2020 01/29/2020  ?Decreased Interest 0 0 0 0 0  ?Down, Depressed, Hopeless 0 0 0 0 0  ?PHQ - 2 Score 0 0 0 0 0  ?Altered sleeping 0 0 0 0 0  ?Tired, decreased energy 0 0 0 0 0  ?Change in appetite 0 0 0 0 0  ?Feeling bad or failure about yourself  0 0 0 0 0  ?Trouble concentrating 0 0 0 0 0  ?Moving slowly or fidgety/restless 0 0 0 0 0  ?Suicidal thoughts 0 0 0 0 0  ?PHQ-9 Score 0 0 0 0 0  ? ?  ?GAD 7 : Generalized Anxiety Score 04/18/2021 11/07/2020 02/26/2020 01/29/2020  ?Nervous, Anxious, on Edge 0 0 0 0  ?Control/stop worrying 0 0 0 0  ?Worry too much - different things 0 0 0 0  ?Trouble relaxing 0 0 0 0  ?Restless 0 0 0 0  ?Easily annoyed or irritable 0 0 0 0  ?Afraid - awful might happen 0 0 0 0  ?Total GAD 7 Score 0 0 0 0  ? ? ? ?Review of Systems:   ?Pertinent items are noted in HPI ?Denies any headaches, blurred vision, fatigue, shortness of breath, chest pain, abdominal pain, abnormal vaginal discharge/itching/odor/irritation, problems with periods, bowel movements, urination, or intercourse unless otherwise stated above. ?Pertinent History Reviewed:  ?Reviewed past  medical,surgical, social and family history.  ?Reviewed problem list, medications and allergies. ?Physical Assessment:  ? ?Vitals:  ? 12/28/21 1108  ?BP: 122/82  ?Pulse: 83  ?Weight: 158 lb (71.7 kg)  ? Body mass index is 27.12 kg/m?. ?  ?     Physical Examination:  ? General appearance - well appearing, and in no distress ? Mental status - alert, oriented to person, place, and time ? Psych:  She has a normal mood and affect ? Skin - warm and dry, normal color, no suspicious lesions noted ? Chest - effort normal ? Heart - normal rate and regular rhythm ? Neck:  midline trachea, no thyromegaly or nodules ? Breasts - breasts appear normal, no suspicious masses ? Abdomen - soft, nontender, nondistended, no masses or organomegaly ? Pelvic - exam deferred ? Extremities:  No swelling or varicosities noted ? ?Chaperone present for exam ? ?No results found for this or any previous visit (from the past 24 hour(s)).  ?Assessment & Plan:  ?1. Routine screening for STI (sexually transmitted infection) ?- Cervicovaginal ancillary only( [place]) ? ?2. Screening for STD (sexually transmitted disease) ?- RPR ?- HIV Antibody (routine testing w rflx) ?- Hepatitis B Surface AntiGEN ? ?3. Lactating mother ?- Pt still breastfeeding, answered questions about benefits of extended nursing and how to educate people who verbally  judge or complain about her decision to continue nursing. ? ?4. Surveillance for Depo-Provera contraception ?- medroxyPROGESTERone (DEPO-PROVERA) injection 150 mg ? ?Routine preventative health maintenance measures emphasized. ?Please refer to After Visit Summary for other counseling recommendations.  ?Mammogram: @ 33yo, or sooner if problems ?Colonoscopy: @ 33yo, or sooner if problems ? ?Orders Placed This Encounter  ?Procedures  ? RPR  ? HIV Antibody (routine testing w rflx)  ? Hepatitis B Surface AntiGEN  ? ?Meds:  ?Meds ordered this encounter  ?Medications  ? medroxyPROGESTERone (DEPO-PROVERA) injection 150  mg  ? ?Follow-up: Return in about 3 months (around 03/30/2022) for DEPO. ? ?Bernerd Limbo, CNM ?12/28/2021 ?1:00 PM ?

## 2021-12-29 LAB — RPR: RPR Ser Ql: NONREACTIVE

## 2021-12-29 LAB — CERVICOVAGINAL ANCILLARY ONLY
Bacterial Vaginitis (gardnerella): NEGATIVE
Candida Glabrata: NEGATIVE
Candida Vaginitis: NEGATIVE
Chlamydia: NEGATIVE
Comment: NEGATIVE
Comment: NEGATIVE
Comment: NEGATIVE
Comment: NEGATIVE
Comment: NEGATIVE
Comment: NORMAL
Neisseria Gonorrhea: NEGATIVE
Trichomonas: NEGATIVE

## 2021-12-29 LAB — HEPATITIS B SURFACE ANTIGEN: Hepatitis B Surface Ag: NEGATIVE

## 2021-12-29 LAB — HIV ANTIBODY (ROUTINE TESTING W REFLEX): HIV Screen 4th Generation wRfx: NONREACTIVE

## 2022-01-03 ENCOUNTER — Ambulatory Visit: Payer: Medicaid Other

## 2022-02-08 IMAGING — DX DG CHEST 1V PORT
1 series · 1 of 1 positions shown · non-contrast
Comparison: None.

CLINICAL DATA: COVID positive. Chest congestion. Productive cough.

EXAM:
PORTABLE CHEST 1 VIEW

[chest ap]
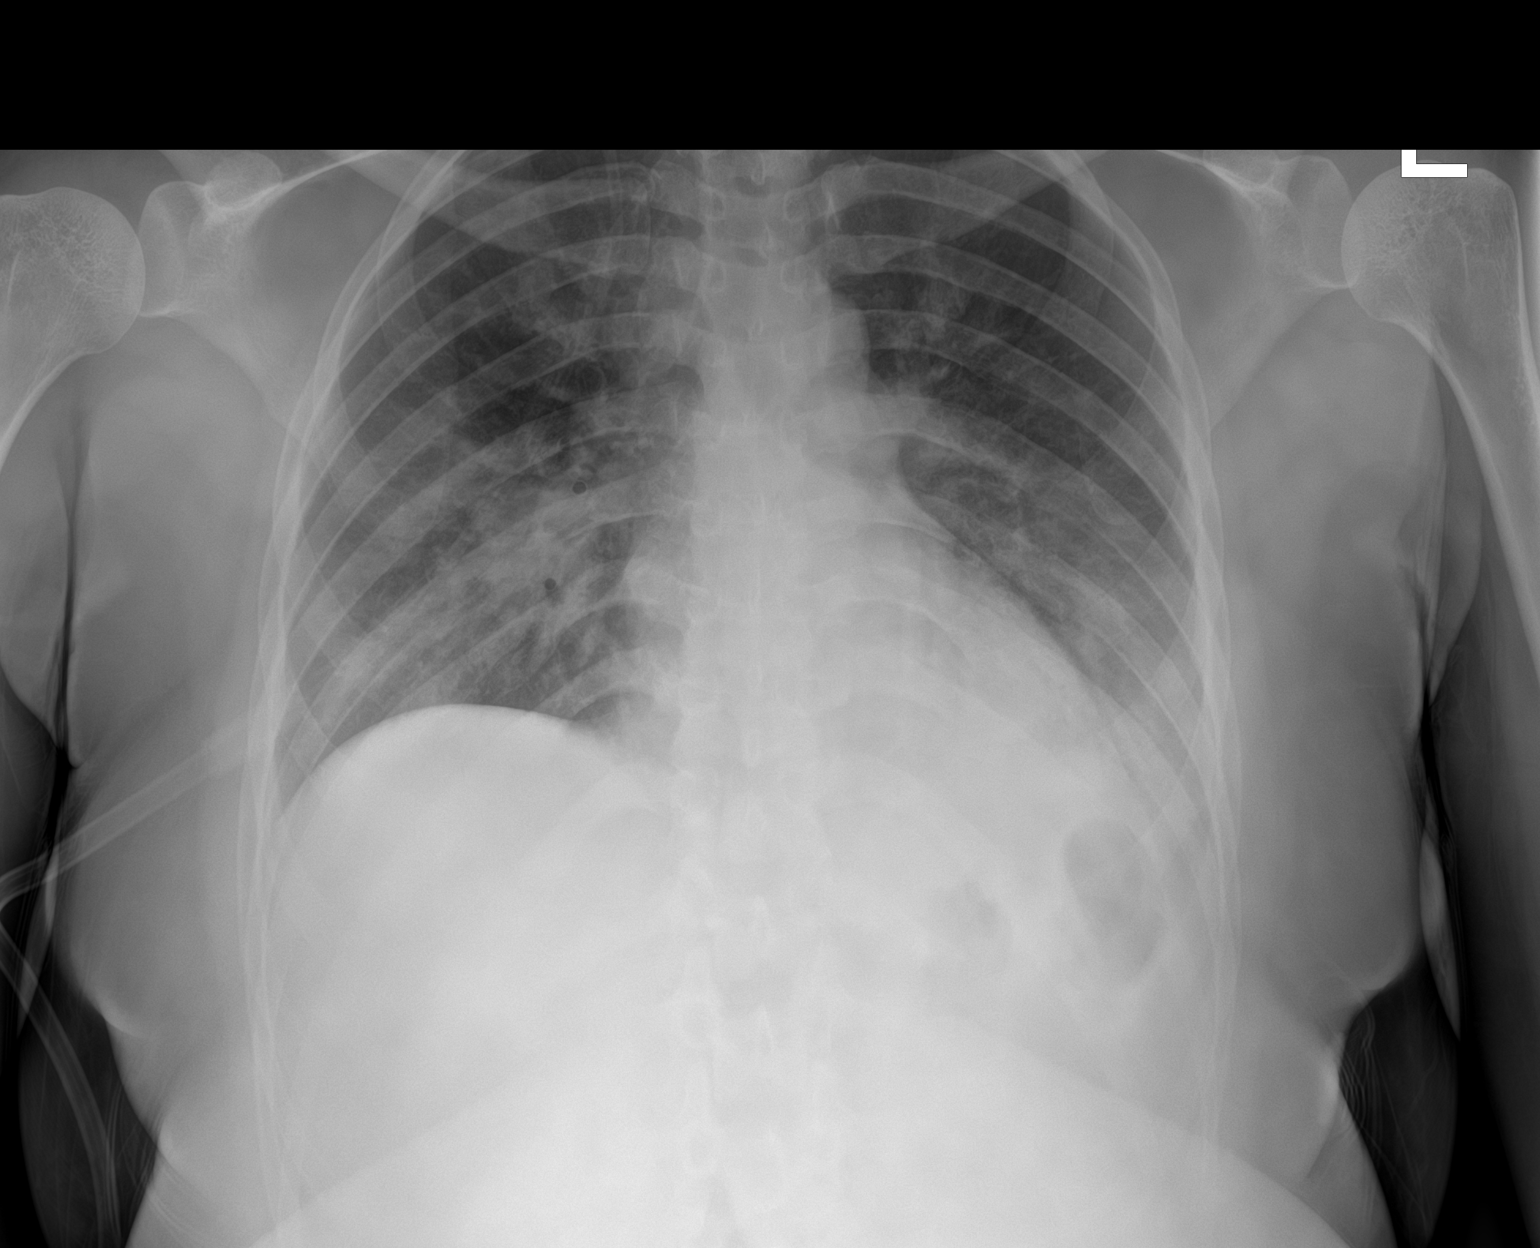

[1 of 1 positions shown; findings below may reference images not displayed]

FINDINGS: Heart size is normal. Lung volumes are low. Patchy bilateral
airspace opacities are present in the right middle lobe and left
lower lobe predominantly. Left effusion is present. Axial skeleton
is unremarkable.
IMPRESSION: 1. Patchy bilateral airspace disease compatible with multifocal
pneumonia.
2. Left pleural effusion.
3. Low lung volumes.

## 2022-03-30 ENCOUNTER — Ambulatory Visit (INDEPENDENT_AMBULATORY_CARE_PROVIDER_SITE_OTHER): Payer: Medicaid Other | Admitting: *Deleted

## 2022-03-30 VITALS — BP 115/83 | HR 70 | Ht 64.0 in | Wt 161.5 lb

## 2022-03-30 DIAGNOSIS — Z3042 Encounter for surveillance of injectable contraceptive: Secondary | ICD-10-CM

## 2022-03-30 LAB — POCT PREGNANCY, URINE: Preg Test, Ur: NEGATIVE

## 2022-03-30 MED ORDER — MEDROXYPROGESTERONE ACETATE 150 MG/ML IM SUSP
150.0000 mg | Freq: Once | INTRAMUSCULAR | Status: AC
  Administered 2022-03-30: 150 mg via INTRAMUSCULAR

## 2022-03-30 NOTE — Progress Notes (Signed)
Here for depo-provera which is 1 day late. Per protocol since it has been 13weeks 1 day need to do pregnancy test. If negative, may resume depo-provera . Pregnancy test was negative. Depo-provera given without complaint. Nancy Fetter

## 2022-06-15 ENCOUNTER — Ambulatory Visit: Payer: Medicaid Other

## 2022-06-20 ENCOUNTER — Ambulatory Visit: Payer: Medicaid Other

## 2022-06-26 ENCOUNTER — Other Ambulatory Visit: Payer: Self-pay

## 2022-06-26 ENCOUNTER — Ambulatory Visit (INDEPENDENT_AMBULATORY_CARE_PROVIDER_SITE_OTHER): Payer: Medicaid Other

## 2022-06-26 ENCOUNTER — Encounter: Payer: Self-pay | Admitting: Family Medicine

## 2022-06-26 VITALS — BP 120/94 | HR 73 | Wt 166.6 lb

## 2022-06-26 DIAGNOSIS — Z3042 Encounter for surveillance of injectable contraceptive: Secondary | ICD-10-CM | POA: Diagnosis not present

## 2022-06-26 MED ORDER — MEDROXYPROGESTERONE ACETATE 150 MG/ML IM SUSP
150.0000 mg | Freq: Once | INTRAMUSCULAR | Status: AC
  Administered 2022-06-26: 150 mg via INTRAMUSCULAR

## 2022-06-26 NOTE — Progress Notes (Signed)
Lauren Delacruz here for Depo-Provera Injection. Injection administered without complication. Patient will return in 3 months for next injection between Nov 28 and Sep 24, 2022. Next annual visit due March 2024.  Pt BP elevated today. Pt states had only coffee to drink, no breakfast. Denies all s/s of hypertension.   Isabell Jarvis, RN 06/26/2022  10:34 AM

## 2022-07-26 IMAGING — CT CT ANGIO CHEST
2 of 7 series · 18 of 46 positions shown · IV contrast (omnipaque)
Comparison: Chest x-ray 09/28/2020

CLINICAL DATA: COVID cough difficulty breathing

EXAM:
CT ANGIOGRAPHY CHEST WITH CONTRAST
TECHNIQUE: Multidetector CT imaging of the chest was performed using the
standard protocol during bolus administration of intravenous
contrast. Multiplanar CT image reconstructions and MIPs were
obtained to evaluate the vascular anatomy.
CONTRAST:  60 mL OMNIPAQUE IOHEXOL 350 MG/ML SOLN

[Series 6: thins · axial · 0.84mm/px · z∈[+1324,+1532]mm · 15 of 234 slices shown]
[im 13/234  lung]
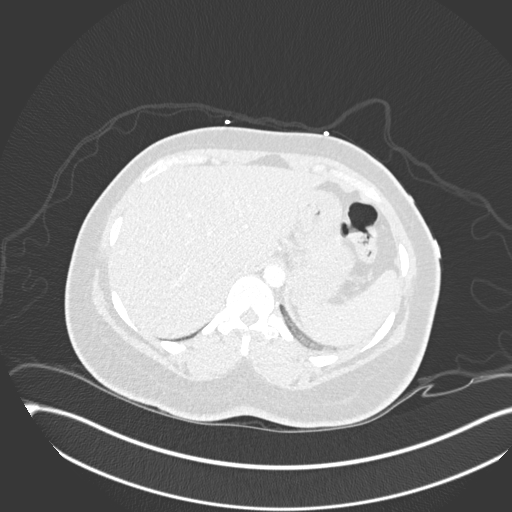
[im 26/234  soft-tissue]
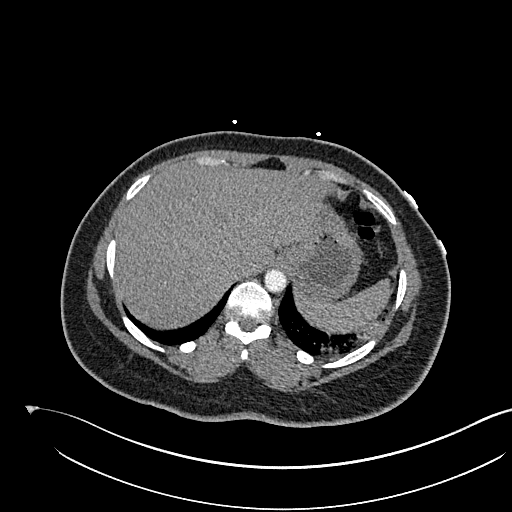
[im 39/234  lung]
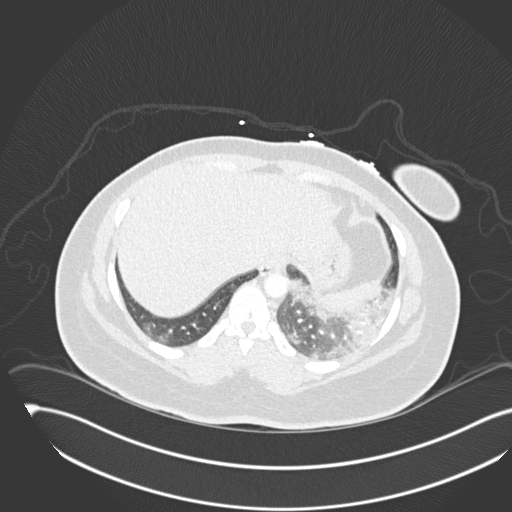
[im 52/234  soft-tissue]
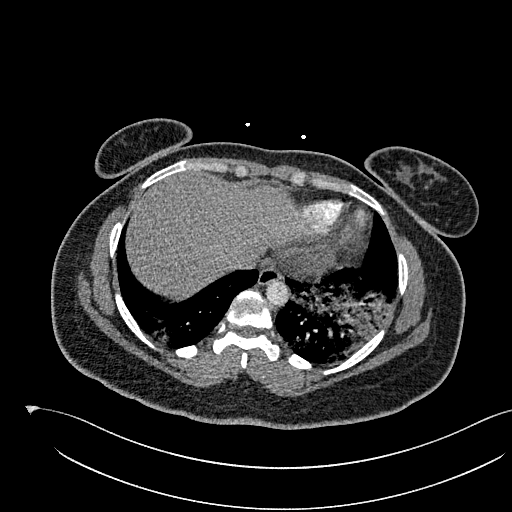
[im 78/234  lung]
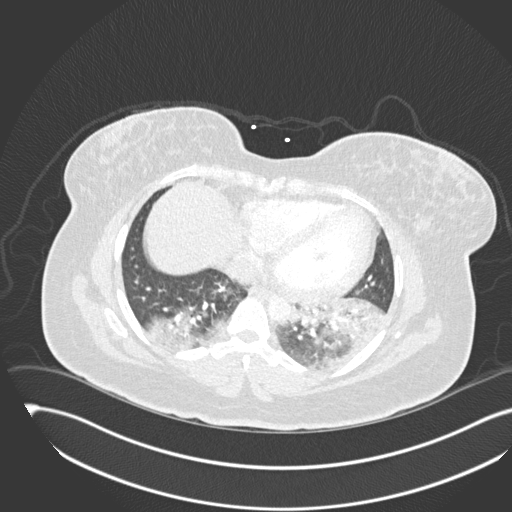
[im 91/234  soft-tissue]
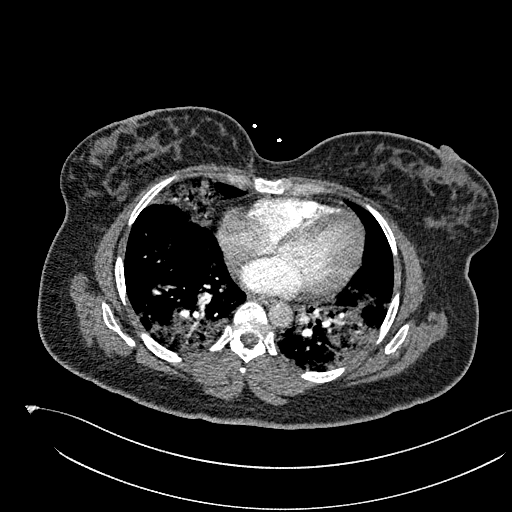
[im 104/234  lung]
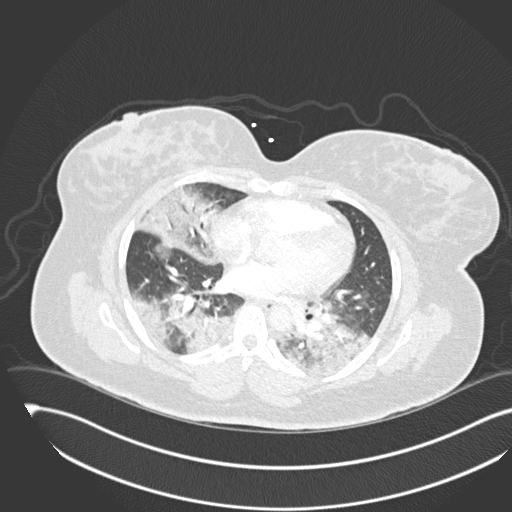
[im 117/234  soft-tissue]
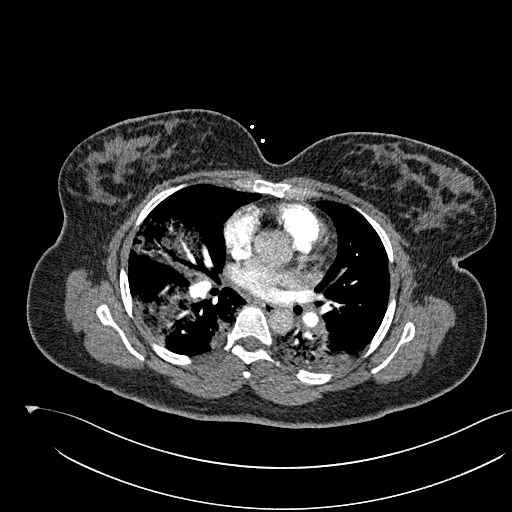
[im 130/234  lung]
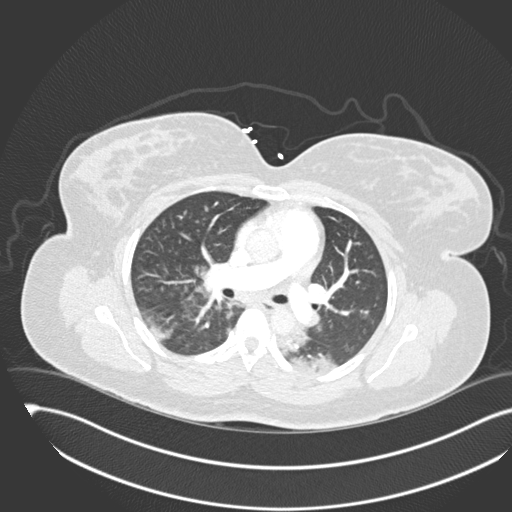
[im 143/234  soft-tissue]
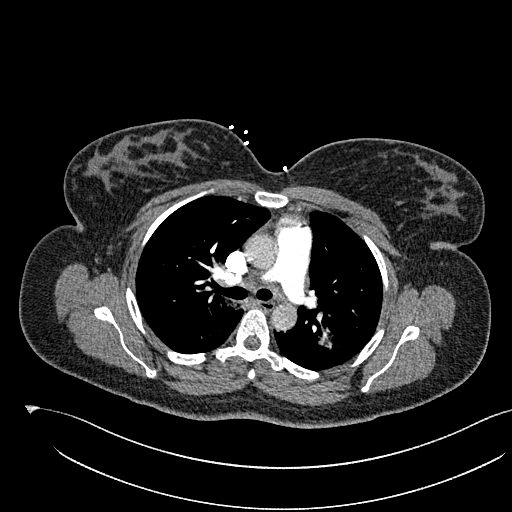
[im 156/234  lung]
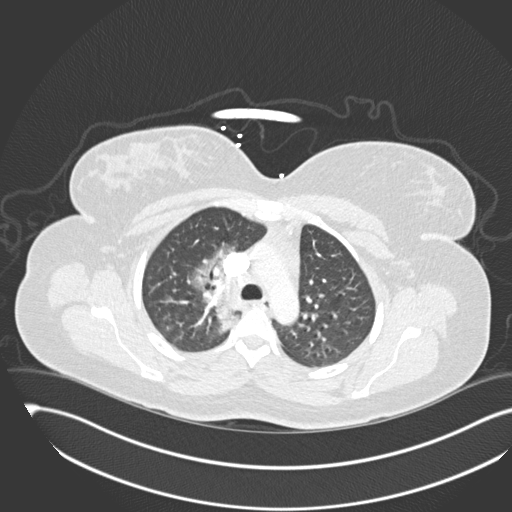
[im 182/234  soft-tissue]
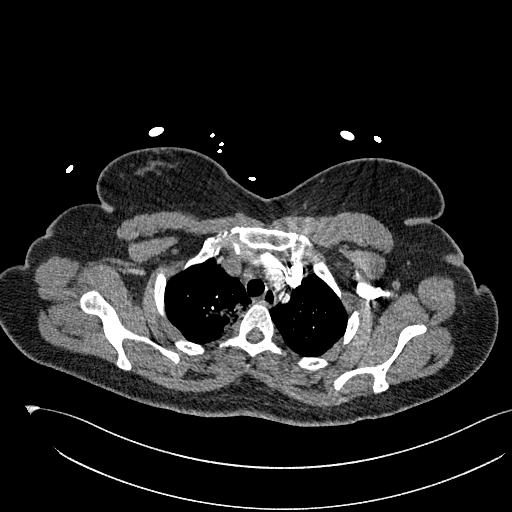
[im 195/234  lung]
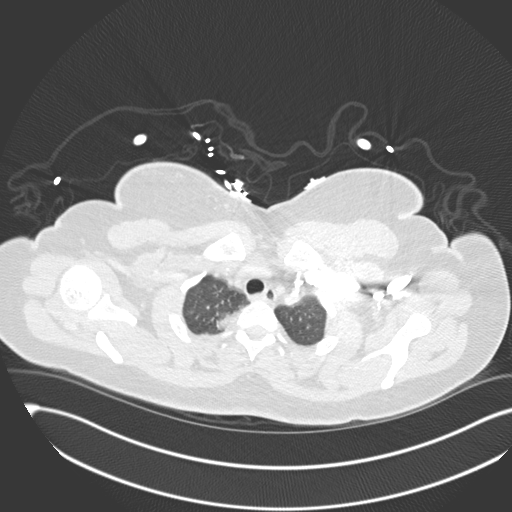
[im 208/234  soft-tissue]
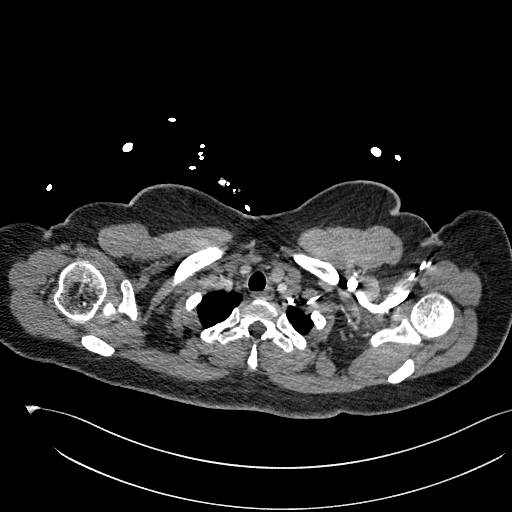
[im 221/234  lung]
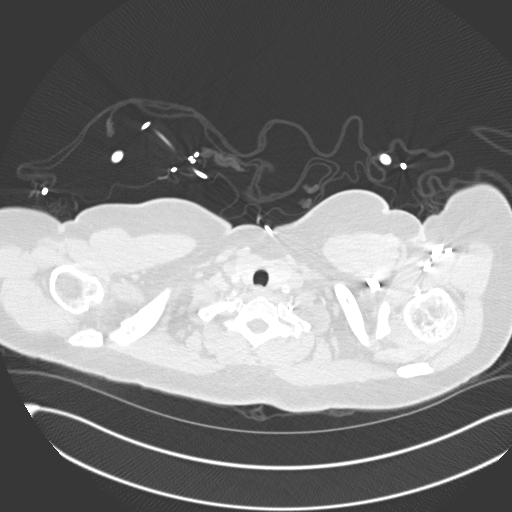

[Series 7: coronal mpr · coronal · 0.46mm/px · 3 of 128 slices shown]
[im 32/128  soft-tissue]
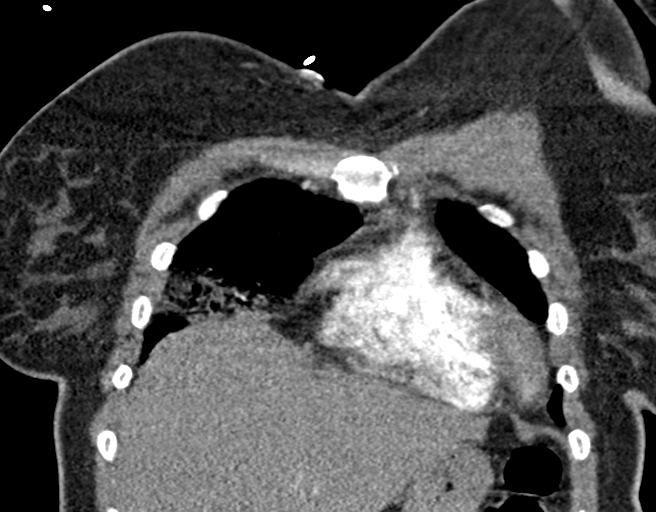
[im 64/128  soft-tissue]
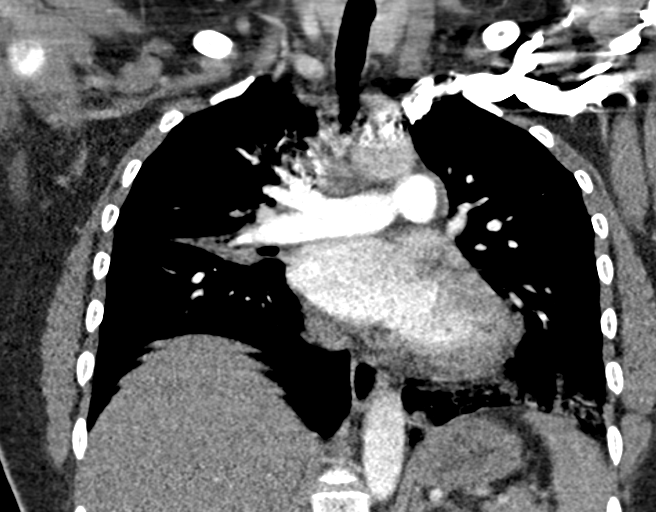
[im 96/128  soft-tissue]
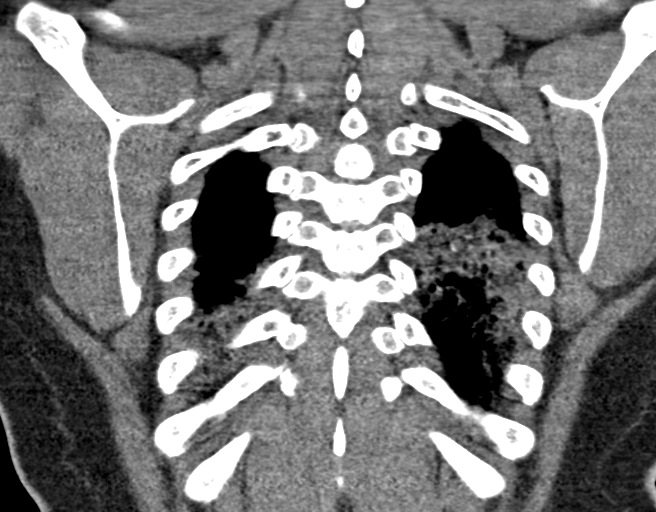

[18 of 46 positions shown; findings below may reference images not displayed]

FINDINGS: Cardiovascular: Satisfactory opacification of the pulmonary arteries
to the segmental level. No evidence of pulmonary embolism.
Nonaneurysmal aorta. Normal cardiac size. No pericardial effusion.

Mediastinum/Nodes: No enlarged mediastinal, hilar, or axillary lymph
nodes. Thyroid gland, trachea, and esophagus demonstrate no
significant findings.

Lungs/Pleura: Fairly extensive bilateral ground-glass densities and
consolidations within the right greater than left lungs. No pleural
effusion or pneumothorax.

Upper Abdomen: No acute abnormality.

Musculoskeletal: No chest wall abnormality. No acute or significant
osseous findings.

Review of the MIP images confirms the above findings.
IMPRESSION: 1. Negative for acute pulmonary embolus.
2. Fairly extensive bilateral ground-glass densities and
consolidations within the right greater than left lungs, consistent
with bilateral pneumonia and history of COVID positivity.

## 2022-08-12 ENCOUNTER — Ambulatory Visit (INDEPENDENT_AMBULATORY_CARE_PROVIDER_SITE_OTHER): Payer: Medicaid Other

## 2022-08-12 ENCOUNTER — Encounter (HOSPITAL_COMMUNITY): Payer: Self-pay | Admitting: Emergency Medicine

## 2022-08-12 ENCOUNTER — Ambulatory Visit (HOSPITAL_COMMUNITY)
Admission: EM | Admit: 2022-08-12 | Discharge: 2022-08-12 | Disposition: A | Discharge status: Home / Self Care | Payer: Medicaid Other | Attending: Physician Assistant | Admitting: Physician Assistant

## 2022-08-12 DIAGNOSIS — Z1152 Encounter for screening for COVID-19: Secondary | ICD-10-CM | POA: Diagnosis not present

## 2022-08-12 DIAGNOSIS — J189 Pneumonia, unspecified organism: Secondary | ICD-10-CM | POA: Diagnosis not present

## 2022-08-12 DIAGNOSIS — R059 Cough, unspecified: Secondary | ICD-10-CM

## 2022-08-12 LAB — RESP PANEL BY RT-PCR (FLU A&B, COVID) ARPGX2
Influenza A by PCR: NEGATIVE
Influenza B by PCR: NEGATIVE
SARS Coronavirus 2 by RT PCR: NEGATIVE

## 2022-08-12 MED ORDER — IBUPROFEN 800 MG PO TABS: 800.0000 mg | ORAL_TABLET | Freq: Three times a day (TID) | ORAL | 0 refills | Status: AC | PRN

## 2022-08-12 MED ORDER — IBUPROFEN 800 MG PO TABS
ORAL_TABLET | ORAL | Status: AC
  Filled 2022-08-12: qty 1

## 2022-08-12 MED ORDER — IBUPROFEN 800 MG PO TABS
800.0000 mg | ORAL_TABLET | Freq: Once | ORAL | Status: AC
  Administered 2022-08-12: 800 mg via ORAL

## 2022-08-12 MED ORDER — LEVOFLOXACIN 500 MG PO TABS: 500.0000 mg | ORAL_TABLET | Freq: Every day | ORAL | 0 refills | Status: AC

## 2022-08-12 NOTE — ED Provider Notes (Signed)
[facility]    CSN: [id number] Arrival date & time: 08/12/22  1230      History   Chief Complaint Chief Complaint  Patient presents with   Shortness of Breath   Migraine   chest congestion   Generalized Body Aches    HPI Lauren Delacruz is a 31 y.o. female.   Pt complains of cough for 2 weeks.  Pt reports she has had a headache and congestion.  Pt reports she has a headache and bodyaches.    The history is provided by the patient.  Shortness of Breath Severity:  Mild Onset quality:  Gradual Duration:  2 weeks Timing:  Constant Progression:  Worsening Chronicity:  New Relieved by:  Nothing Worsened by:  Nothing Associated symptoms: cough   Migraine This is a new problem. The problem occurs constantly. Associated symptoms include shortness of breath. She has tried nothing for the symptoms.    Past Medical History:  Diagnosis Date   COVID-19 09/2020   Gestational diabetes    History of gestational diabetes mellitus (GDM)    History of ovarian cyst    Pneumonia 09/2020    Patient Active Problem List   Diagnosis Date Noted   SVD (spontaneous vaginal delivery) 03/13/2020   Alpha thalassemia silent carrier 11/10/2019   History of gestational diabetes mellitus (GDM)     Past Surgical History:  Procedure Laterality Date   WISDOM TOOTH EXTRACTION  2019    OB History     Gravida  3   Para  2   Term  1   Preterm  1   AB  1   Living  2      SAB  0   IAB  1   Ectopic  0   Multiple  0   Live Births  2            Home Medications    Prior to Admission medications   Medication Sig Start Date End Date Taking? Authorizing Provider  Ferrous Sulfate (IRON PO) Take 1 tablet by mouth daily.    [provider]  medroxyPROGESTERone (DEPO-PROVERA) 150 MG/ML injection Inject 150 mg into the muscle every 3 (three) months.    [provider]  VITAMIN D PO Take 1 tablet by mouth daily.    [provider]     Family History Family History  Problem Relation Age of Onset   Hypertension Mother    Hypertension Father    Prostate cancer Father    Breast cancer Paternal Aunt    Hypertension Maternal Grandmother     Social History Social History   Tobacco Use   Smoking status: Former   Smokeless tobacco: Never   Tobacco comments:    socially  Building services engineer Use: Never used  Substance Use Topics   Alcohol use: Not Currently    Comment: socially   Drug use: Never     Allergies   Penicillins   Review of Systems Review of Systems  Respiratory:  Positive for cough and shortness of breath.   All other systems reviewed and are negative.    Physical Exam Triage Vital Signs ED Triage Vitals [08/12/22 1407]  Enc Vitals Group     BP 115/83     Pulse Rate 81     Resp 18     Temp 98.6 F (37 C)     Temp Source Oral     SpO2 100 %     Weight  Height      Head Circumference      Peak Flow      Pain Score 10     Pain Loc      Pain Edu?      Excl. in [place]?    No data found.  Updated Vital Signs BP 115/83 (BP Location: Right Arm)   Pulse 81   Temp 98.6 F (37 C) (Oral)   Resp 18   SpO2 100%   Visual Acuity Right Eye Distance:   Left Eye Distance:   Bilateral Distance:    Right Eye Near:   Left Eye Near:    Bilateral Near:     Physical Exam Vitals and nursing note reviewed.  Constitutional:      Appearance: She is well-developed.  HENT:     Head: Normocephalic.  Cardiovascular:     Rate and Rhythm: Normal rate and regular rhythm.  Pulmonary:     Effort: Pulmonary effort is normal.     Breath sounds: No decreased breath sounds.  Chest:     Chest wall: No tenderness.  Abdominal:     General: There is no distension.     Palpations: Abdomen is soft.     Tenderness: There is no abdominal tenderness.  Musculoskeletal:        General: Normal range of motion.     Cervical back: Normal range of motion.  Skin:    General: Skin is warm.   Neurological:     Mental Status: She is alert and oriented to person, place, and time.      UC Treatments / Results  Labs (all labs ordered are listed, but only abnormal results are displayed) Labs Reviewed  RESP PANEL BY RT-PCR (FLU A&B, COVID) [id number]    EKG   Radiology DG Chest 2 View  Result Date: 08/12/2022 CLINICAL DATA:  Cough EXAM: CHEST - 2 VIEW COMPARISON:  09/28/2020 FINDINGS: Cardiomegaly. Mild, diffuse bilateral interstitial pulmonary opacity. The visualized skeletal structures are unremarkable. IMPRESSION: Cardiomegaly with mild, diffuse interstitial pulmonary opacity, consistent with edema or atypical/viral infection. No focal airspace opacity. Electronically Signed   By: Delanna Ahmadi M.D.   On: 08/12/2022 14:34    Procedures Procedures (including critical care time)  Medications Ordered in UC Medications  ibuprofen (ADVIL) tablet 800 mg (800 mg Oral Given 08/12/22 1429)    Initial Impression / Assessment and Plan / UC Course  I have reviewed the triage vital signs and the nursing notes.  Pertinent labs & imaging results that were available during my care of the patient were reviewed by me and considered in my medical decision making (see chart for details).     MDM:  chest xray shows interstitial opacities consistent with edema.atypical/viral infection  Final Clinical Impressions(s) / UC Diagnoses   Final diagnoses:  Pneumonia of both lungs due to infectious organism, unspecified part of lung     Discharge Instructions      Return if any problems  Schedule primary care appointment for evaltuion     ED Prescriptions     Medication Sig Dispense Auth. Provider   levofloxacin (LEVAQUIN) 500 MG tablet Take 1 tablet (500 mg total) by mouth daily for 10 days. 7 tablet Matilde Pottenger K, [place]   ibuprofen (ADVIL) 800 MG tablet Take 1 tablet (800 mg total) by mouth every 8 (eight) hours as needed. 30 tablet Fransico Meadow, [place]      PDMP not reviewed  this encounter. An After Visit Summary was printed  and given to the patient.    Elson Areas, [place] 08/12/22 1458

## 2022-08-12 NOTE — ED Triage Notes (Signed)
Pt reports shortness of breath and chest congestion for over 2 weeks, migraine and generalized body aches x 3 days. States she has been taking Theraflu, Mucinex and Ibuprofen for relief.  Also endorses pain on both sides of lower abdomen. States she is concerned due to taking Remdesivir for covid and having to have her kidney and liver function monitored due the side effects.

## 2022-08-12 NOTE — Discharge Instructions (Addendum)
Return if any problems  Schedule primary care appointment for evaltuion

## 2022-08-13 ENCOUNTER — Telehealth (HOSPITAL_COMMUNITY): Payer: Self-pay | Admitting: *Deleted

## 2022-08-14 ENCOUNTER — Encounter: Payer: Self-pay | Admitting: Emergency Medicine

## 2022-09-02 DIAGNOSIS — H5213 Myopia, bilateral: Secondary | ICD-10-CM | POA: Diagnosis not present

## 2022-09-07 ENCOUNTER — Ambulatory Visit (HOSPITAL_COMMUNITY): Payer: Medicaid Other

## 2022-09-11 ENCOUNTER — Ambulatory Visit: Payer: Medicaid Other

## 2022-09-11 NOTE — Progress Notes (Deleted)
Lauren Delacruz here for Depo-Provera Injection. Injection administered without complication. Patient will return in 3 months for next injection between *** and ***. Next annual visit due ***.   Meryl Crutch, RN 09/11/2022  8:11 AM

## 2022-09-19 ENCOUNTER — Ambulatory Visit (INDEPENDENT_AMBULATORY_CARE_PROVIDER_SITE_OTHER): Payer: Medicaid Other | Admitting: General Practice

## 2022-09-19 VITALS — BP 120/88 | HR 85 | Ht 64.0 in | Wt 166.0 lb

## 2022-09-19 DIAGNOSIS — Z3042 Encounter for surveillance of injectable contraceptive: Secondary | ICD-10-CM | POA: Diagnosis not present

## 2022-09-19 MED ORDER — MEDROXYPROGESTERONE ACETATE 150 MG/ML IM SUSP
150.0000 mg | Freq: Once | INTRAMUSCULAR | Status: AC
  Administered 2022-09-19: 150 mg via INTRAMUSCULAR

## 2022-09-19 NOTE — Progress Notes (Signed)
Lauren Delacruz here for Depo-Provera Injection. Injection administered without complication. Patient will return in 3 months for next injection between 2/21 and 3/7. Next annual visit due after December 29 2022.   Marylynn Pearson, RN 09/19/2022  9:10 AM

## 2022-12-05 ENCOUNTER — Ambulatory Visit: Payer: Medicaid Other

## 2022-12-12 ENCOUNTER — Ambulatory Visit (INDEPENDENT_AMBULATORY_CARE_PROVIDER_SITE_OTHER): Payer: Medicaid Other

## 2022-12-12 DIAGNOSIS — Z3042 Encounter for surveillance of injectable contraceptive: Secondary | ICD-10-CM | POA: Diagnosis not present

## 2022-12-12 MED ORDER — MEDROXYPROGESTERONE ACETATE 150 MG/ML IM SUSY
150.0000 mg | PREFILLED_SYRINGE | Freq: Once | INTRAMUSCULAR | Status: AC
  Administered 2022-12-12: 150 mg via INTRAMUSCULAR

## 2022-12-12 NOTE — Addendum Note (Signed)
Addended by: Madalyn Rob D on: 12/12/2022 10:01 AM   Modules accepted: Level of Service

## 2022-12-12 NOTE — Progress Notes (Signed)
Axel Filler here for Depo-Provera Injection. Injection administered without complication. Patient will return in 3 months for next injection between 05/16 and 05/30. Next annual visit due March 2024.    Martina Sinner, RN 12/12/2022  9:03 AM

## 2023-06-05 ENCOUNTER — Encounter: Payer: Self-pay | Admitting: Family Medicine

## 2023-06-05 ENCOUNTER — Other Ambulatory Visit: Payer: Self-pay

## 2023-06-05 ENCOUNTER — Ambulatory Visit (INDEPENDENT_AMBULATORY_CARE_PROVIDER_SITE_OTHER): Payer: Medicaid Other | Admitting: Family Medicine

## 2023-06-05 VITALS — BP 131/93 | HR 84 | Wt 171.6 lb

## 2023-06-05 DIAGNOSIS — Z3042 Encounter for surveillance of injectable contraceptive: Secondary | ICD-10-CM | POA: Diagnosis not present

## 2023-06-05 DIAGNOSIS — Z01419 Encounter for gynecological examination (general) (routine) without abnormal findings: Secondary | ICD-10-CM | POA: Diagnosis not present

## 2023-06-05 LAB — POCT PREGNANCY, URINE: Preg Test, Ur: NEGATIVE

## 2023-06-05 MED ORDER — MEDROXYPROGESTERONE ACETATE 150 MG/ML IM SUSP
150.0000 mg | Freq: Once | INTRAMUSCULAR | Status: AC
  Administered 2023-06-05: 150 mg via INTRAMUSCULAR

## 2023-06-05 NOTE — Progress Notes (Signed)
   GYNECOLOGY ANNUAL PREVENTATIVE CARE ENCOUNTER NOTE  Subjective:   Lauren Delacruz is a 32 y.o. [phone] female here for a routine annual gynecologic exam.  Current complaints: None.   Breastfeeding toddler (32yo).  On depo but last dose was in February. Has not had sex for over 1 year. Coaches cheerleading and working full time. Likes not having her cycle.   Denies abnormal vaginal bleeding, discharge, pelvic pain, problems with intercourse or other gynecologic concerns.    Gynecologic History No LMP recorded. Patient has had an injection.  Had spotting this month Contraception: Depo-Provera injections-- none since Feb Last Pap: 2021. Results were: normal Last mammogram: NA.   Health Maintenance Due  Topic Date Due   COVID-19 Vaccine (1 - 2023-24 season) Never done   INFLUENZA VACCINE  05/16/2023    The following portions of the patient's history were reviewed and updated as appropriate: allergies, current medications, past family history, past medical history, past social history, past surgical history and problem list.  Review of Systems Pertinent items are noted in HPI.   Objective:  BP (!) 131/93   Pulse 84   Wt 171 lb 9.6 oz (77.8 kg)   BMI 29.46 kg/m  CONSTITUTIONAL: Well-developed, well-nourished female in no acute distress.  HENT:  Normocephalic, atraumatic EYES:  No scleral icterus.  NECK: Normal range of motion, supple, no masses.  Normal thyroid.  SKIN: Skin is warm and dry. No rash noted. Not diaphoretic. No erythema. No pallor. NEUROLOGIC: Alert and oriented to person, place, and time. Normal reflexes, muscle tone coordination. No cranial nerve deficit noted. PSYCHIATRIC: Normal mood and affect. Normal behavior. Normal judgment and thought content. CARDIOVASCULAR: Normal heart rate noted, regular rhythm. 2+ distal pulses. RESPIRATORY: Effort and breath sounds normal, no problems with respiration noted. BREASTS: Symmetric in size. No masses, skin changes,  nipple drainage, or lymphadenopathy. ABDOMEN: Soft,  no distention noted.  No tenderness, rebound or guarding.  PELVIC: deferred MUSCULOSKELETAL: Normal range of motion.    Assessment and Plan:  1) Annual gynecologic examination:  Routine preventative health maintenance measures emphasized.  2) Contraception counseling: Likes Depo and also is relying on LAM. Currently nursing 32 yo ;) Reviewed bone mineral density reduction with sustained depo. Encouraged calcium, vitamin D and weight bearing exercise   Please refer to After Visit Summary for other counseling recommendations.   Return in about 1 year (around 06/04/2024) for Yearly wellness exam.  Federico Flake, MD, MPH, ABFM Attending Physician Center for [facility]

## 2023-06-05 NOTE — Addendum Note (Signed)
Addended by: Brien Mates T on: 06/05/2023 02:32 PM   Modules accepted: Orders

## 2023-09-30 DIAGNOSIS — Z3202 Encounter for pregnancy test, result negative: Secondary | ICD-10-CM | POA: Diagnosis not present

## 2023-09-30 DIAGNOSIS — M5432 Sciatica, left side: Secondary | ICD-10-CM | POA: Diagnosis not present

## 2023-09-30 DIAGNOSIS — M549 Dorsalgia, unspecified: Secondary | ICD-10-CM | POA: Diagnosis not present
